# Patient Record
Sex: Female | Born: 1940 | Race: White | Hispanic: No | Smoking: Current some day smoker
Health system: Southern US, Community
[De-identification: ages and names within clinical notes are randomized; demographics above are authoritative.]

## PROBLEM LIST (undated history)

## (undated) DIAGNOSIS — E785 Hyperlipidemia, unspecified: Secondary | ICD-10-CM

## (undated) DIAGNOSIS — I10 Essential (primary) hypertension: Secondary | ICD-10-CM

## (undated) DIAGNOSIS — I4891 Unspecified atrial fibrillation: Secondary | ICD-10-CM

---
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
## 1999-10-07 ENCOUNTER — Other Ambulatory Visit: Admission: RE | Admit: 1999-10-07 | Discharge: 1999-10-07 | Payer: Self-pay | Admitting: Family Medicine

## 2000-01-13 ENCOUNTER — Ambulatory Visit (HOSPITAL_COMMUNITY): Admission: RE | Admit: 2000-01-13 | Discharge: 2000-01-13 | Payer: Self-pay | Admitting: Gastroenterology

## 2018-10-24 ENCOUNTER — Other Ambulatory Visit: Payer: Self-pay | Admitting: Family Medicine

## 2018-10-24 DIAGNOSIS — Z122 Encounter for screening for malignant neoplasm of respiratory organs: Secondary | ICD-10-CM

## 2018-11-01 ENCOUNTER — Encounter: Payer: Self-pay | Admitting: Cardiology

## 2018-11-01 DIAGNOSIS — I4891 Unspecified atrial fibrillation: Secondary | ICD-10-CM

## 2018-11-01 DIAGNOSIS — E785 Hyperlipidemia, unspecified: Secondary | ICD-10-CM

## 2018-11-01 DIAGNOSIS — E782 Mixed hyperlipidemia: Secondary | ICD-10-CM | POA: Insufficient documentation

## 2018-11-01 HISTORY — DX: Hyperlipidemia, unspecified: E78.5

## 2018-11-01 HISTORY — DX: Unspecified atrial fibrillation: I48.91

## 2018-11-02 ENCOUNTER — Ambulatory Visit: Payer: Self-pay | Admitting: Cardiology

## 2018-11-05 ENCOUNTER — Ambulatory Visit: Payer: Self-pay

## 2018-11-12 ENCOUNTER — Ambulatory Visit (INDEPENDENT_AMBULATORY_CARE_PROVIDER_SITE_OTHER): Payer: Medicare Other | Admitting: Cardiology

## 2018-11-12 ENCOUNTER — Other Ambulatory Visit: Payer: Self-pay

## 2018-11-12 ENCOUNTER — Encounter: Payer: Self-pay | Admitting: Cardiology

## 2018-11-12 VITALS — BP 157/89 | HR 83 | Temp 98.9°F | Ht 65.5 in | Wt 135.0 lb

## 2018-11-12 DIAGNOSIS — I10 Essential (primary) hypertension: Secondary | ICD-10-CM

## 2018-11-12 DIAGNOSIS — I480 Paroxysmal atrial fibrillation: Secondary | ICD-10-CM

## 2018-11-12 NOTE — Progress Notes (Signed)
Patient referred by [name], * for atrial fibrilaltion  Subjective:   Jill Jackson, female    DOB: 11/13/1940, 78 y.o.   MRN: [id number]   Chief Complaint  Patient presents with  . Atrial Fibrillation  . New Patient (Initial Visit)    HPI  78 y.o. Caucasian female with hypertension, former smoker, referred for management of atrial fibrillation.  Patient was recently undergoing preop workup before cataract surgery, when she was incidentally found to be in Afib. She was then started on eliquis by Dr. Arelia Sneddon. Cataract surgery has since been on hold. She is also supposed to undergo colonoscopy for FOBT and a dental procedure-both also on hold.   Patient lives by herself, with her grand daughter helping her with grocery shopping etc. Patient cooks her own meals, performs daily chores. She has noticed shortness of breath with minimal exertion, but denies chest pain, orthopnea, PND, leg edema, palpitations, presyncope/syncope.   Past Medical History:  Diagnosis Date  . Atrial fibrillation ([place]) 11/01/2018  . HLD (hyperlipidemia) 11/01/2018    Past Surgical History:  Procedure Laterality Date  . TUBAL LIGATION  1977    2 Social History   Socioeconomic History  . Marital status: Widowed    Spouse name: Not on file  . Number of children: 4  . Years of education: Not on file  . Highest education level: Not on file  Occupational History  . Not on file  Social Needs  . Financial resource strain: Not on file  . Food insecurity    Worry: Not on file    Inability: Not on file  . Transportation needs    Medical: Not on file    Non-medical: Not on file  Tobacco Use  . Smoking status: Former Smoker    Packs/day: 1.00    Years: 25.00    Pack years: 25.00    Types: Cigarettes    Quit date: 1999    Years since quitting: 21.6  Substance and Sexual Activity  . Alcohol use: Yes    Comment: wine once a year  . Drug use: Not Currently  . Sexual  activity: Not Currently  Lifestyle  . Physical activity    Days per week: Not on file    Minutes per session: Not on file  . Stress: Not on file  Relationships  . Social Herbalist on phone: Not on file    Gets together: Not on file    Attends religious service: Not on file    Active member of club or organization: Not on file    Attends meetings of clubs or organizations: Not on file    Relationship status: Not on file  . Intimate partner violence    Fear of current or ex partner: Not on file    Emotionally abused: Not on file    Physically abused: Not on file    Forced sexual activity: Not on file  Other Topics Concern  . Not on file  Social History Narrative  . Not on file     Family History  Problem Relation Age of Onset  . Stroke Mother   . Stroke Brother      Current Outpatient Medications on File Prior to Visit  Medication Sig Dispense Refill  . apixaban (ELIQUIS) 5 MG TABS tablet Take 5 mg by mouth 2 (two) times daily.    . diphenhydrAMINE (BENADRYL) 25 MG tablet Take 25 mg by mouth every 6 (six) hours as  needed.    . diphenhydrAMINE-APAP, sleep, (TYLENOL PM EXTRA STRENGTH PO) Take by mouth as needed.    . lovastatin (MEVACOR) 20 MG tablet Take 20 mg by mouth at bedtime.    . naproxen (NAPROSYN) 500 MG tablet Take 500 mg by mouth as needed.     No current facility-administered medications on file prior to visit.     Cardiovascular studies:  EKG 11/12/2018: Sinus rhythm 81 bpm.   Left atrial enlargement.  Nonspecific ST depression.  EKG 10/18/2018: Atrial fibrillation with RVR 122 bpm. Low voltage.   Recent labs: 10/18/2018: Glucose 80. BUN/Cr 16/0.72. eGFR 80/92. Na/K 142/4.3. Rest of the CMP normal. H/H 13/40. MCV 96. TSH 5.5 High   Review of Systems  Constitution: Negative for decreased appetite, malaise/fatigue, weight gain and weight loss.  HENT: Negative for congestion.   Eyes: Negative for visual disturbance.  Cardiovascular:  Positive for dyspnea on exertion. Negative for chest pain, leg swelling, palpitations and syncope.  Respiratory: Positive for shortness of breath. Negative for cough.   Endocrine: Negative for cold intolerance.  Hematologic/Lymphatic: Does not bruise/bleed easily.  Skin: Negative for itching and rash.  Musculoskeletal: Negative for myalgias.  Gastrointestinal: Negative for abdominal pain, nausea and vomiting.  Genitourinary: Negative for dysuria.  Neurological: Negative for dizziness and weakness.  Psychiatric/Behavioral: The patient is not nervous/anxious.   All other systems reviewed and are negative.        Vitals:   11/12/18 1337  BP: (!) 157/89  Pulse: 83  Temp: 98.9 F (37.2 C)  SpO2: 94%     Body mass index is 22.12 kg/m. Filed Weights   11/12/18 1337  Weight: 135 lb (61.2 kg)     Objective:   Physical Exam  Constitutional: She is oriented to person, place, and time. She appears well-developed and well-nourished. No distress.  HENT:  Head: Normocephalic and atraumatic.  Eyes: Pupils are equal, round, and reactive to light. Conjunctivae are normal.  Neck: No JVD present.  Cardiovascular: Normal rate and regular rhythm. Exam reveals decreased pulses.  Pulmonary/Chest: Effort normal and breath sounds normal. She has no wheezes. She has no rales.  Abdominal: Soft. Bowel sounds are normal. There is no rebound.  Musculoskeletal:        General: No edema.  Lymphadenopathy:    She has no cervical adenopathy.  Neurological: She is alert and oriented to person, place, and time. No cranial nerve deficit.  Skin: Skin is warm and dry.  Psychiatric: She has a normal mood and affect.  Nursing note and vitals reviewed.         Assessment & Recommendations:   78 y.o. Caucasian female with hypertension, former smoker, referred for management of atrial fibrillation.  Paroxysmal atrial fibrillation: Currently in sinus rhythm. CHA2DS2VAsc score 4, annual stroke risk  5%. Continue eliquis 5 mg bid. Avoid NSAIDS. Recommend echocardiogram and nuclear stress test given her exertional dyspnea-which could be angina equivalent. Low suspicion for OSA.  Hypertension: Not on any antihypertensive agents. Started lisinopril 10 mg daily. Will check BMP and lipid panel in one week. She would like to get this checked at PCP office.   Further recommendations after above testing.   Thank you for referring the patient to [place]. Please feel free to contact with any questions.  Nigel Mormon, MD [facility] [place] Pager: [phone] Office: [phone] If no answer Cell [phone]

## 2018-11-13 ENCOUNTER — Other Ambulatory Visit: Payer: Self-pay | Admitting: Cardiology

## 2018-11-13 ENCOUNTER — Telehealth: Payer: Self-pay

## 2018-11-13 DIAGNOSIS — E782 Mixed hyperlipidemia: Secondary | ICD-10-CM

## 2018-11-13 DIAGNOSIS — I10 Essential (primary) hypertension: Secondary | ICD-10-CM

## 2018-11-13 DIAGNOSIS — I480 Paroxysmal atrial fibrillation: Secondary | ICD-10-CM

## 2018-11-13 NOTE — Telephone Encounter (Signed)
LMOM with details

## 2018-11-13 NOTE — Telephone Encounter (Signed)
-----   Message from [facility], MD sent at 11/12/2018  8:56 PM EDT ----- Regarding: Naproxen Please let the patient know that I forgot to mention on 8/9. While on eliquis, she should avoid naproxen/aleve/motrin/ibuprofen as they can increase bleeding risk. Tylenol is okay.  Thanks MJP

## 2018-11-14 ENCOUNTER — Telehealth: Payer: Self-pay

## 2018-11-14 ENCOUNTER — Other Ambulatory Visit: Payer: Self-pay

## 2018-11-14 MED ORDER — LISINOPRIL 5 MG PO TABS: 5.0000 mg | ORAL_TABLET | Freq: Every day | ORAL | 3 refills | Status: DC

## 2018-11-14 NOTE — Telephone Encounter (Signed)
Hold 2 days prior to the procedure. Resume after the procedure.  Thanks MJP

## 2018-11-14 NOTE — Telephone Encounter (Signed)
Pt called in wanting confirmation on how to hold eliquis if needed prior to dental appt. She said it was discussed but she could remember your instructions.//ah

## 2018-11-15 NOTE — Telephone Encounter (Signed)
Pt aware.//ah

## 2018-11-19 ENCOUNTER — Other Ambulatory Visit: Payer: Self-pay

## 2018-11-19 ENCOUNTER — Ambulatory Visit (INDEPENDENT_AMBULATORY_CARE_PROVIDER_SITE_OTHER): Payer: Medicare Other

## 2018-11-19 DIAGNOSIS — I480 Paroxysmal atrial fibrillation: Secondary | ICD-10-CM

## 2018-11-20 NOTE — Progress Notes (Signed)
Pt aware of results 

## 2018-11-23 ENCOUNTER — Other Ambulatory Visit: Payer: Self-pay

## 2018-11-23 ENCOUNTER — Ambulatory Visit (INDEPENDENT_AMBULATORY_CARE_PROVIDER_SITE_OTHER): Payer: Medicare Other

## 2018-11-23 DIAGNOSIS — I480 Paroxysmal atrial fibrillation: Secondary | ICD-10-CM

## 2018-11-27 DIAGNOSIS — I361 Nonrheumatic tricuspid (valve) insufficiency: Secondary | ICD-10-CM | POA: Insufficient documentation

## 2018-11-27 DIAGNOSIS — I340 Nonrheumatic mitral (valve) insufficiency: Secondary | ICD-10-CM | POA: Insufficient documentation

## 2018-11-27 NOTE — Progress Notes (Signed)
  Patient referred by Elkins, Wilson Oliver, * for atrial fibrilaltion  Subjective:   Jill Jackson, female    DOB: 07/01/1940, 78 y.o.   MRN: [id number]   Chief Complaint  Patient presents with  . Atrial Fibrillation  . Results    NUC and echo  . Follow-up    HPI  78 y.o. Caucasian female with hypertension, former smoker, paroxysmal atrial fibrillation.  Workup showed EF 61%, grade 1 DD, mild myxomatous degeneration with mild to moderate mitral regurgitation, mild tricuspid regurgitation, estimated PASP 28 mmHg. Lexiscan nuclear stress test did not show ischemia/infarction.   Patient has had no new symptoms, but has stable exertional dyspnea walking uphill. She has not had any prior pulmonary evaluation.   She is tolerating anticoagulation without any bleeding issues.   Past Medical History:  Diagnosis Date  . Atrial fibrillation ([facility]) 11/01/2018  . HLD (hyperlipidemia) 11/01/2018    Past Surgical History:  Procedure Laterality Date  . TUBAL LIGATION  1977    2 Social History   Socioeconomic History  . Marital status: Widowed    Spouse name: Not on file  . Number of children: 4  . Years of education: Not on file  . Highest education level: Not on file  Occupational History  . Not on file  Social Needs  . Financial resource strain: Not on file  . Food insecurity    Worry: Not on file    Inability: Not on file  . Transportation needs    Medical: Not on file    Non-medical: Not on file  Tobacco Use  . Smoking status: Former Smoker    Packs/day: 1.00    Years: 25.00    Pack years: 25.00    Types: Cigarettes    Quit date: 1999    Years since quitting: 21.6  . Smokeless tobacco: Never Used  Substance and Sexual Activity  . Alcohol use: Yes    Comment: wine once a year  . Drug use: Not Currently  . Sexual activity: Not Currently  Lifestyle  . Physical activity    Days per week: Not on file    Minutes per session: Not on file  . Stress: Not  on file  Relationships  . Social connections    Talks on phone: Not on file    Gets together: Not on file    Attends religious service: Not on file    Active member of club or organization: Not on file    Attends meetings of clubs or organizations: Not on file    Relationship status: Not on file  . Intimate partner violence    Fear of current or ex partner: Not on file    Emotionally abused: Not on file    Physically abused: Not on file    Forced sexual activity: Not on file  Other Topics Concern  . Not on file  Social History Narrative  . Not on file     Family History  Problem Relation Age of Onset  . Stroke Mother   . Stroke Brother      Current Outpatient Medications on File Prior to Visit  Medication Sig Dispense Refill  . apixaban (ELIQUIS) 5 MG TABS tablet Take 5 mg by mouth 2 (two) times daily.    . diphenhydrAMINE (BENADRYL) 25 MG tablet Take 25 mg by mouth every 6 (six) hours as needed.    . diphenhydrAMINE-APAP, sleep, (TYLENOL PM EXTRA STRENGTH PO) Take by mouth as needed.    .     Patient referred by Elkins, Wilson Oliver, * for atrial fibrilaltion  Subjective:   Jill Jackson, female    DOB: 10/10/1940, 78 y.o.   MRN: [id number]   Chief Complaint  Patient presents with  . Atrial Fibrillation  . Results    NUC and echo  . Follow-up    HPI  78 y.o. Caucasian female with hypertension, former smoker, paroxysmal atrial fibrillation.  Workup showed EF 61%, grade 1 DD, mild myxomatous degeneration with mild to moderate mitral regurgitation, mild tricuspid regurgitation, estimated PASP 28 mmHg. Lexiscan nuclear stress test did not show ischemia/infarction.   Patient has had no new symptoms, but has stable exertional dyspnea walking uphill. She has not had any prior pulmonary evaluation.   She is tolerating anticoagulation without any bleeding issues.   Past Medical History:  Diagnosis Date  . Atrial fibrillation ([facility]) 11/01/2018  . HLD (hyperlipidemia) 11/01/2018    Past Surgical History:  Procedure Laterality Date  . TUBAL LIGATION  1977    2 Social History   Socioeconomic History  . Marital status: Widowed    Spouse name: Not on file  . Number of children: 4  . Years of education: Not on file  . Highest education level: Not on file  Occupational History  . Not on file  Social Needs  . Financial resource strain: Not on file  . Food insecurity    Worry: Not on file    Inability: Not on file  . Transportation needs    Medical: Not on file    Non-medical: Not on file  Tobacco Use  . Smoking status: Former Smoker    Packs/day: 1.00    Years: 25.00    Pack years: 25.00    Types: Cigarettes    Quit date: 1999    Years since quitting: 21.6  . Smokeless tobacco: Never Used  Substance and Sexual Activity  . Alcohol use: Yes    Comment: wine once a year  . Drug use: Not Currently  . Sexual activity: Not Currently  Lifestyle  . Physical activity    Days per week: Not on file    Minutes per session: Not on file  . Stress: Not  on file  Relationships  . Social connections    Talks on phone: Not on file    Gets together: Not on file    Attends religious service: Not on file    Active member of club or organization: Not on file    Attends meetings of clubs or organizations: Not on file    Relationship status: Not on file  . Intimate partner violence    Fear of current or ex partner: Not on file    Emotionally abused: Not on file    Physically abused: Not on file    Forced sexual activity: Not on file  Other Topics Concern  . Not on file  Social History Narrative  . Not on file     Family History  Problem Relation Age of Onset  . Stroke Mother   . Stroke Brother      Current Outpatient Medications on File Prior to Visit  Medication Sig Dispense Refill  . apixaban (ELIQUIS) 5 MG TABS tablet Take 5 mg by mouth 2 (two) times daily.    . diphenhydrAMINE (BENADRYL) 25 MG tablet Take 25 mg by mouth every 6 (six) hours as needed.    . diphenhydrAMINE-APAP, sleep, (TYLENOL PM EXTRA STRENGTH PO) Take by mouth as needed.    .     Patient referred by Elkins, Wilson Oliver, * for atrial fibrilaltion  Subjective:   Jill Jackson, female    DOB: 07/01/1940, 78 y.o.   MRN: [id number]   Chief Complaint  Patient presents with  . Atrial Fibrillation  . Results    NUC and echo  . Follow-up    HPI  78 y.o. Caucasian female with hypertension, former smoker, paroxysmal atrial fibrillation.  Workup showed EF 61%, grade 1 DD, mild myxomatous degeneration with mild to moderate mitral regurgitation, mild tricuspid regurgitation, estimated PASP 28 mmHg. Lexiscan nuclear stress test did not show ischemia/infarction.   Patient has had no new symptoms, but has stable exertional dyspnea walking uphill. She has not had any prior pulmonary evaluation.   She is tolerating anticoagulation without any bleeding issues.   Past Medical History:  Diagnosis Date  . Atrial fibrillation ([facility]) 11/01/2018  . HLD (hyperlipidemia) 11/01/2018    Past Surgical History:  Procedure Laterality Date  . TUBAL LIGATION  1977    2 Social History   Socioeconomic History  . Marital status: Widowed    Spouse name: Not on file  . Number of children: 4  . Years of education: Not on file  . Highest education level: Not on file  Occupational History  . Not on file  Social Needs  . Financial resource strain: Not on file  . Food insecurity    Worry: Not on file    Inability: Not on file  . Transportation needs    Medical: Not on file    Non-medical: Not on file  Tobacco Use  . Smoking status: Former Smoker    Packs/day: 1.00    Years: 25.00    Pack years: 25.00    Types: Cigarettes    Quit date: 1999    Years since quitting: 21.6  . Smokeless tobacco: Never Used  Substance and Sexual Activity  . Alcohol use: Yes    Comment: wine once a year  . Drug use: Not Currently  . Sexual activity: Not Currently  Lifestyle  . Physical activity    Days per week: Not on file    Minutes per session: Not on file  . Stress: Not  on file  Relationships  . Social connections    Talks on phone: Not on file    Gets together: Not on file    Attends religious service: Not on file    Active member of club or organization: Not on file    Attends meetings of clubs or organizations: Not on file    Relationship status: Not on file  . Intimate partner violence    Fear of current or ex partner: Not on file    Emotionally abused: Not on file    Physically abused: Not on file    Forced sexual activity: Not on file  Other Topics Concern  . Not on file  Social History Narrative  . Not on file     Family History  Problem Relation Age of Onset  . Stroke Mother   . Stroke Brother      Current Outpatient Medications on File Prior to Visit  Medication Sig Dispense Refill  . apixaban (ELIQUIS) 5 MG TABS tablet Take 5 mg by mouth 2 (two) times daily.    . diphenhydrAMINE (BENADRYL) 25 MG tablet Take 25 mg by mouth every 6 (six) hours as needed.    . diphenhydrAMINE-APAP, sleep, (TYLENOL PM EXTRA STRENGTH PO) Take by mouth as needed.    .

## 2018-11-30 ENCOUNTER — Ambulatory Visit (INDEPENDENT_AMBULATORY_CARE_PROVIDER_SITE_OTHER): Payer: Medicare Other | Admitting: Cardiology

## 2018-11-30 ENCOUNTER — Other Ambulatory Visit: Payer: Self-pay

## 2018-11-30 ENCOUNTER — Encounter: Payer: Self-pay | Admitting: Cardiology

## 2018-11-30 VITALS — BP 144/69 | HR 72 | Temp 99.0°F | Ht 65.0 in | Wt 135.2 lb

## 2018-11-30 DIAGNOSIS — I361 Nonrheumatic tricuspid (valve) insufficiency: Secondary | ICD-10-CM | POA: Diagnosis not present

## 2018-11-30 DIAGNOSIS — I480 Paroxysmal atrial fibrillation: Secondary | ICD-10-CM | POA: Diagnosis not present

## 2018-11-30 DIAGNOSIS — I10 Essential (primary) hypertension: Secondary | ICD-10-CM | POA: Diagnosis not present

## 2018-11-30 DIAGNOSIS — E782 Mixed hyperlipidemia: Secondary | ICD-10-CM

## 2018-12-01 ENCOUNTER — Encounter: Payer: Self-pay | Admitting: Cardiology

## 2019-01-19 ENCOUNTER — Encounter: Payer: Self-pay | Admitting: Cardiology

## 2019-03-19 ENCOUNTER — Telehealth: Payer: Self-pay

## 2019-03-19 ENCOUNTER — Other Ambulatory Visit: Payer: Self-pay

## 2019-03-19 MED ORDER — DILTIAZEM HCL ER COATED BEADS 180 MG PO CP24: 180.0000 mg | ORAL_CAPSULE | Freq: Every day | ORAL | 0 refills | Status: DC

## 2019-03-19 NOTE — Telephone Encounter (Signed)
Telephone encounter:  Reason for call: Patient calling for reflll of Diltiazem, I do not see this on her list, she said it was prescribed for her by JG .  Usual provider: MP  Last office visit: 11/30/18  Next office visit: 06/07/19   Last hospitalization: na   Current Outpatient Medications on File Prior to Visit  Medication Sig Dispense Refill  . apixaban (ELIQUIS) 5 MG TABS tablet Take 5 mg by mouth 2 (two) times daily.    . diphenhydrAMINE (BENADRYL) 25 MG tablet Take 25 mg by mouth every 6 (six) hours as needed.    . diphenhydrAMINE-APAP, sleep, (TYLENOL PM EXTRA STRENGTH PO) Take by mouth as needed.    . Latanoprostene Bunod (VYZULTA) 0.024 % SOLN Apply 1 drop to eye daily.    Marland Kitchen lisinopril (ZESTRIL) 5 MG tablet Take 1 tablet (5 mg total) by mouth daily. 90 tablet 3  . lovastatin (MEVACOR) 20 MG tablet Take 20 mg by mouth at bedtime.    . Melatonin 5 MG CHEW Chew by mouth.     No current facility-administered medications on file prior to visit.

## 2019-03-19 NOTE — Telephone Encounter (Signed)
I do not see it was prescribed by me. Can you check with her who prescribed?  Thanks MJP

## 2019-03-19 NOTE — Telephone Encounter (Signed)
Ok to refill 

## 2019-03-19 NOTE — Telephone Encounter (Signed)
She said Dr. Einar Gip

## 2019-06-07 ENCOUNTER — Ambulatory Visit: Payer: Medicare Other | Admitting: Cardiology

## 2019-06-12 ENCOUNTER — Other Ambulatory Visit: Payer: Self-pay | Admitting: Cardiology

## 2019-06-17 ENCOUNTER — Other Ambulatory Visit: Payer: Self-pay

## 2019-06-17 ENCOUNTER — Encounter: Payer: Self-pay | Admitting: Cardiology

## 2019-06-17 ENCOUNTER — Ambulatory Visit: Payer: Medicare PPO | Admitting: Cardiology

## 2019-06-17 VITALS — BP 139/64 | HR 60 | Temp 95.1°F | Ht 65.0 in | Wt 135.0 lb

## 2019-06-17 DIAGNOSIS — I480 Paroxysmal atrial fibrillation: Secondary | ICD-10-CM

## 2019-06-17 DIAGNOSIS — E782 Mixed hyperlipidemia: Secondary | ICD-10-CM

## 2019-06-17 DIAGNOSIS — I361 Nonrheumatic tricuspid (valve) insufficiency: Secondary | ICD-10-CM

## 2019-06-17 DIAGNOSIS — I10 Essential (primary) hypertension: Secondary | ICD-10-CM

## 2019-06-17 MED ORDER — APIXABAN 5 MG PO TABS: 5.0000 mg | ORAL_TABLET | Freq: Two times a day (BID) | ORAL | 3 refills | Status: DC

## 2019-06-17 MED ORDER — DILTIAZEM HCL ER COATED BEADS 180 MG PO CP24: 180.0000 mg | ORAL_CAPSULE | Freq: Every day | ORAL | 3 refills | Status: DC

## 2019-06-17 NOTE — Progress Notes (Signed)
Patient referred by [name], * for atrial fibrilaltion  Subjective:   Jill Jackson, female    DOB: 1940-09-28, 79 y.o.   MRN: [id number]   Chief Complaint  Patient presents with  . Atrial Fibrillation    HPI  79 y.o. Caucasian female with hypertension, former smoker, paroxysmal atrial fibrillation.  Workup shoed EF 61%, grade 1 DD, mild myxomatous degeneration with mild to moderate mitral regurgitation, mild tricuspid regurgitation, estimated PASP 28 mmHg. Lexiscan nuclear stress test did not show ischemia/infarction. IU suspected her exertional dyspnea was more likely due to history of smoking and possible COPD.  Patient is here for 6 month follow up. She is doing well.  She has not had any new symptoms.  She has stable mild exertional dyspnea.  Denies any chest pain.  She is tolerating anticoagulation without any bleeding issues.    Current Outpatient Medications on File Prior to Visit  Medication Sig Dispense Refill  . apixaban (ELIQUIS) 5 MG TABS tablet Take 5 mg by mouth 2 (two) times daily.    Marland Kitchen diltiazem (CARDIZEM CD) 180 MG 24 hr capsule TAKE 1 CAPSULE(180 MG) BY MOUTH DAILY 90 capsule 0  . diphenhydrAMINE (BENADRYL) 25 MG tablet Take 25 mg by mouth every 6 (six) hours as needed.    . diphenhydrAMINE-APAP, sleep, (TYLENOL PM EXTRA STRENGTH PO) Take by mouth as needed.    . Latanoprostene Bunod (VYZULTA) 0.024 % SOLN Apply 1 drop to eye daily.    Marland Kitchen lisinopril (ZESTRIL) 5 MG tablet Take 1 tablet (5 mg total) by mouth daily. 90 tablet 3  . lovastatin (MEVACOR) 20 MG tablet Take 20 mg by mouth at bedtime.    . Melatonin 5 MG CHEW Chew by mouth.     No current facility-administered medications on file prior to visit.    Cardiovascular studies:  EKG 06/17/2019: Atrial rhythm 57 bpm. Nonspecific ST abnormalities.  Echocardiogram 11/23/2018:  Left ventricle cavity is normal in size. Normal left ventricular wall thickness. Normal LV systolic  function with EF 61%. Normal global wall motion. Doppler evidence of grade I (impaired) diastolic dysfunction, normal LAP.  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 Mild tricuspid regurgitation. Estimated pulmonary artery systolic pressure is 28 mmHg. Estimated RA pressure 8 mmHg.  Lexiscan Myoview stress test 11/19/2018: Lexiscan stress test was performed. Stress EKG is non-diagnostic, as this is pharmacological stress test. Myocardial perfusion imaging is normal. LVEF 68%. Low risk study.  EKG 11/12/2018: Sinus rhythm 81 bpm.   Left atrial enlargement.  Nonspecific ST depression.  EKG 10/18/2018: Atrial fibrillation with RVR 122 bpm. Low voltage.   Recent labs: 11/23/2018: Glucose 93. BUN/Cr 15/0.78. eGFT 73. Na/K 140/4.8.  Chol 154, TG 106, HDL 70, LDL 63.  10/18/2018: Glucose 80. BUN/Cr 16/0.72. eGFR 80/92. Na/K 142/4.3. Rest of the CMP normal. H/H 13/40. MCV 96. TSH 5.5 High   Review of Systems  Cardiovascular: Positive for dyspnea on exertion. Negative for chest pain, leg swelling, palpitations and syncope.  Respiratory: Positive for shortness of breath.   Musculoskeletal: Positive for back pain.        Vitals:   06/17/19 1013  BP: 139/64  Pulse: 60  Temp: (!) 95.1 F (35.1 C)  SpO2: 97%     Body mass index is 22.47 kg/m. Filed Weights   06/17/19 1013  Weight: 135 lb (61.2 kg)     Objective:   Physical Exam  Constitutional: She appears well-developed and well-nourished.  Neck: No JVD present.  Cardiovascular: Normal rate, regular rhythm, normal heart sounds and intact distal pulses.  No murmur heard. Pulmonary/Chest: Effort normal and breath sounds normal. She has no wheezes. She has no rales.  Musculoskeletal:        General: No edema.  Nursing note and vitals reviewed.      Assessment & Recommendations:   79 y.o. Caucasian female with hypertension, former smoker, paroxysmal atrial fibrillation, mild to  moderate MR, mild TR.  Paroxysmal atrial fibrillation: Currently in stable ectopic atrial rhythm. No indication for antiarrhythmic therapy. CHA2DS2VAsc score 4, annual stroke risk 5%. Continue eliquis 5 mg bid. Avoid NSAIDS. Low suspicion for OSA.  Hypertension: Continue lisinopril 10 mg daily.  Mild to moderate MR, mild TR: Recommend repeat echocardiogram in 04/2020.  Exertional dyspnea: Cardiac workup is under whelming for her symptoms. Given her h/o smoking, consider pulmonary function testing.   Follow-up in 1 year.   Nigel Mormon, MD [facility] [place] Pager: [phone] Office: [phone] If no answer Cell [phone]

## 2019-07-26 ENCOUNTER — Observation Stay (HOSPITAL_COMMUNITY)
Admission: EM | Admit: 2019-07-26 | Discharge: 2019-07-27 | Disposition: A | Discharge status: Home / Self Care | Payer: Medicare PPO | Attending: Family Medicine | Admitting: Family Medicine

## 2019-07-26 ENCOUNTER — Encounter (HOSPITAL_COMMUNITY): Payer: Self-pay | Admitting: Emergency Medicine

## 2019-07-26 ENCOUNTER — Other Ambulatory Visit: Payer: Self-pay

## 2019-07-26 ENCOUNTER — Emergency Department (HOSPITAL_COMMUNITY): Payer: Medicare PPO

## 2019-07-26 DIAGNOSIS — Z79899 Other long term (current) drug therapy: Secondary | ICD-10-CM | POA: Diagnosis not present

## 2019-07-26 DIAGNOSIS — Z20822 Contact with and (suspected) exposure to covid-19: Secondary | ICD-10-CM | POA: Diagnosis not present

## 2019-07-26 DIAGNOSIS — I2510 Atherosclerotic heart disease of native coronary artery without angina pectoris: Secondary | ICD-10-CM | POA: Insufficient documentation

## 2019-07-26 DIAGNOSIS — Z7901 Long term (current) use of anticoagulants: Secondary | ICD-10-CM | POA: Diagnosis not present

## 2019-07-26 DIAGNOSIS — R0602 Shortness of breath: Secondary | ICD-10-CM

## 2019-07-26 DIAGNOSIS — Z881 Allergy status to other antibiotic agents status: Secondary | ICD-10-CM | POA: Insufficient documentation

## 2019-07-26 DIAGNOSIS — J439 Emphysema, unspecified: Secondary | ICD-10-CM | POA: Insufficient documentation

## 2019-07-26 DIAGNOSIS — E782 Mixed hyperlipidemia: Secondary | ICD-10-CM | POA: Diagnosis present

## 2019-07-26 DIAGNOSIS — I700 Atherosclerosis of aorta: Secondary | ICD-10-CM | POA: Insufficient documentation

## 2019-07-26 DIAGNOSIS — R079 Chest pain, unspecified: Principal | ICD-10-CM | POA: Insufficient documentation

## 2019-07-26 DIAGNOSIS — I10 Essential (primary) hypertension: Secondary | ICD-10-CM | POA: Insufficient documentation

## 2019-07-26 DIAGNOSIS — I4891 Unspecified atrial fibrillation: Secondary | ICD-10-CM | POA: Diagnosis present

## 2019-07-26 DIAGNOSIS — I480 Paroxysmal atrial fibrillation: Secondary | ICD-10-CM | POA: Diagnosis not present

## 2019-07-26 DIAGNOSIS — Z87891 Personal history of nicotine dependence: Secondary | ICD-10-CM | POA: Diagnosis not present

## 2019-07-26 DIAGNOSIS — Z823 Family history of stroke: Secondary | ICD-10-CM | POA: Diagnosis not present

## 2019-07-26 DIAGNOSIS — I209 Angina pectoris, unspecified: Secondary | ICD-10-CM | POA: Diagnosis present

## 2019-07-26 DIAGNOSIS — Z720 Tobacco use: Secondary | ICD-10-CM

## 2019-07-26 LAB — CBC
HCT: 39.4 % (ref 36.0–46.0)
Hemoglobin: 12.8 g/dL (ref 12.0–15.0)
MCH: 31.4 pg (ref 26.0–34.0)
MCHC: 32.5 g/dL (ref 30.0–36.0)
MCV: 96.6 fL (ref 80.0–100.0)
Platelets: 329 10*3/uL (ref 150–400)
RBC: 4.08 MIL/uL (ref 3.87–5.11)
RDW: 13.2 % (ref 11.5–15.5)
WBC: 9.3 10*3/uL (ref 4.0–10.5)
nRBC: 0 % (ref 0.0–0.2)

## 2019-07-26 LAB — BASIC METABOLIC PANEL
Anion gap: 9 (ref 5–15)
BUN: 20 mg/dL (ref 8–23)
CO2: 25 mmol/L (ref 22–32)
Calcium: 9.2 mg/dL (ref 8.9–10.3)
Chloride: 104 mmol/L (ref 98–111)
Creatinine, Ser: 0.81 mg/dL (ref 0.44–1.00)
GFR calc Af Amer: >60 mL/min (ref >60)
GFR calc non Af Amer: >60 mL/min (ref >60)
Glucose, Bld: 103 mg/dL — ABNORMAL HIGH (ref 70–99)
Potassium: 4.3 mmol/L (ref 3.5–5.1)
Sodium: 138 mmol/L (ref 135–145)

## 2019-07-26 LAB — TROPONIN I (HIGH SENSITIVITY)
Troponin I (High Sensitivity): 6 ng/L (ref <18)
Troponin I (High Sensitivity): 7 ng/L (ref <18)

## 2019-07-26 MED ORDER — SODIUM CHLORIDE 0.9% FLUSH
3.0000 mL | Freq: Once | INTRAVENOUS | Status: AC
  Administered 2019-07-26: 3 mL via INTRAVENOUS

## 2019-07-26 NOTE — ED Provider Notes (Signed)
[facility] EMERGENCY DEPARTMENT Provider Note   CSN: [id number] Arrival date & time: 07/26/19  1606     History Chief Complaint  Patient presents with  . Shortness of Breath    Jill Jackson is a 79 y.o. female with a hx of paroxsymal a-fib (on Eliquis) presents to the Emergency Department complaining of gradual, persistent, progressively worsening "feeling unwell" onset 3 days ago.  Pt reports some intermittent left sided chest pain and SOB which became constant this morning.  Pt reports peripheral edema, generalized weakness, lightheadedness, nausea and intermittent headaches as well. She denies known sick contacts.  Pt reports she presented to her PCP and was told that she was in a-fib in the office, however ECG taken at the office does not show a-fib.  Pt reports her shortness of breath worsens significantly with exertion which is abnormal.  Pt is a current smoker.  Nothing seems to make the symptoms better.  No treatments PTA.     The history is provided by the patient and medical records. No language interpreter was used.    HPI: A 79 year old patient with a history of hypertension and hypercholesterolemia presents for evaluation of chest pain. Initial onset of pain was more than 6 hours ago. The patient's chest pain is sharp and is worse with exertion. The patient complains of nausea. The patient's chest pain is middle- or left-sided, is not well-localized, is not described as heaviness/pressure/tightness and does not radiate to the arms/jaw/neck. The patient denies diaphoresis. The patient has smoked in the past 90 days. The patient has no history of stroke, has no history of peripheral artery disease, denies any history of treated diabetes, has no relevant family history of coronary artery disease (first degree relative at less than age 36) and does not have an elevated BMI (>=30).   Past Medical History:  Diagnosis Date  . Atrial fibrillation (HCC)  11/01/2018  . HLD (hyperlipidemia) 11/01/2018    Patient Active Problem List   Diagnosis Date Noted  . 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 Nonrheumatic mitral valve regurgitation 11/27/2018  . Essential hypertension 11/12/2018  . Mixed hyperlipidemia 11/01/2018  . Atrial fibrillation (HCC) 11/01/2018    Past Surgical History:  Procedure Laterality Date  . TUBAL LIGATION  1977     OB History   No obstetric history on file.     Family History  Problem Relation Age of Onset  . Stroke Mother   . Stroke Brother     Social History   Tobacco Use  . Smoking status: Former Smoker    Packs/day: 1.00    Years: 25.00    Pack years: 25.00    Types: Cigarettes    Quit date: 1999    Years since quitting: 22.3  . Smokeless tobacco: Never Used  Substance Use Topics  . Alcohol use: Yes    Comment: wine once a year  . Drug use: Not Currently    Home Medications Prior to Admission medications   Medication Sig Start Date End Date Taking? Authorizing Provider  acetaminophen (TYLENOL) 500 MG tablet Take 500 mg by mouth 2 (two) times daily as needed for mild pain or headache.   Yes [provider]  alendronate (FOSAMAX) 70 MG tablet Take 1 tablet by mouth every Wednesday. Drink a glass of water 30 minutes before medication and sit upright for 30 minutes. 07/09/19  Yes [provider]  apixaban (ELIQUIS) 5 MG TABS tablet Take 1 tablet (5 mg total) by  mouth 2 (two) times daily. 06/17/19  Yes Patwardhan, Manish J, MD  bismuth subsalicylate (PEPTO BISMOL) 262 MG/15ML suspension Take 15 mLs by mouth every 6 (six) hours as needed for indigestion.   Yes [provider]  diltiazem (CARDIZEM CD) 180 MG 24 hr capsule Take 1 capsule (180 mg total) by mouth daily. 06/17/19  Yes Patwardhan, Manish J, MD  diphenhydrAMINE (BENADRYL) 25 MG tablet Take 25 mg by mouth every 6 (six) hours as needed for itching or allergies.    Yes [provider]    diphenhydrAMINE-APAP, sleep, (TYLENOL PM EXTRA STRENGTH PO) Take 0.5 tablets by mouth at bedtime as needed (Sleep).    Yes [provider]  latanoprost (XALATAN) 0.005 % ophthalmic solution Place 1 drop into both eyes at bedtime.   Yes [provider]  lovastatin (MEVACOR) 20 MG tablet Take 20 mg by mouth at bedtime.   Yes [provider]  Melatonin 5 MG CHEW Chew 1 tablet by mouth at bedtime as needed (Sleep).    Yes [provider]  omeprazole (PRILOSEC) 40 MG capsule Take 40 mg by mouth daily as needed (Acid reflux).    Yes [provider]  psyllium (METAMUCIL) 58.6 % powder Take 1 packet by mouth daily as needed (GI issues).    Yes [provider]    Allergies    Tetracyclines & related  Review of Systems   Review of Systems  Constitutional: Positive for fatigue. Negative for appetite change, diaphoresis, fever and unexpected weight change.  HENT: Negative for mouth sores.   Eyes: Negative for visual disturbance.  Respiratory: Positive for shortness of breath. Negative for cough, chest tightness and wheezing.   Cardiovascular: Positive for chest pain and leg swelling.  Gastrointestinal: Positive for nausea. Negative for abdominal pain, constipation, diarrhea and vomiting.  Endocrine: Negative for polydipsia, polyphagia and polyuria.  Genitourinary: Negative for dysuria, frequency, hematuria and urgency.  Musculoskeletal: Negative for back pain and neck stiffness.  Skin: Negative for rash.  Allergic/Immunologic: Negative for immunocompromised state.  Neurological: Positive for light-headedness and headaches. Negative for syncope.  Hematological: Does not bruise/bleed easily.  Psychiatric/Behavioral: Negative for sleep disturbance. The patient is not nervous/anxious.     Physical Exam Updated Vital Signs BP 131/67 (BP Location: Left Arm)   Pulse 63   Temp 98.4 F (36.9 C) (Oral)   Resp 16   Ht 5\' 3"  (1.6 m)   Wt 61.7 kg    SpO2 96%   BMI 24.09 kg/m   Physical Exam Vitals and nursing note reviewed.  Constitutional:      General: She is not in acute distress.    Appearance: She is not diaphoretic.  HENT:     Head: Normocephalic.  Eyes:     General: No scleral icterus.    Conjunctiva/sclera: Conjunctivae normal.  Cardiovascular:     Rate and Rhythm: Normal rate and regular rhythm.     Pulses: Normal pulses.          Radial pulses are 2+ on the right side and 2+ on the left side.     Heart sounds: Murmur present.  Pulmonary:     Effort: Pulmonary effort is normal. No tachypnea, accessory muscle usage, prolonged expiration, respiratory distress or retractions.     Breath sounds: Normal breath sounds. No stridor.     Comments: Equal chest rise. No increased work of breathing. Abdominal:     General: There is no distension.     Palpations: Abdomen is soft.  Tenderness: There is no abdominal tenderness. There is no guarding or rebound.  Musculoskeletal:     Cervical back: Normal range of motion.     Comments: Moves all extremities equally and without difficulty.  Skin:    General: Skin is warm and dry.     Capillary Refill: Capillary refill takes less than 2 seconds.  Neurological:     Mental Status: She is alert.     GCS: GCS eye subscore is 4. GCS verbal subscore is 5. GCS motor subscore is 6.     Comments: Speech is clear and goal oriented.  Psychiatric:        Mood and Affect: Mood normal.     ED Results / Procedures / Treatments   Labs (all labs ordered are listed, but only abnormal results are displayed) Labs Reviewed  BASIC METABOLIC PANEL - Abnormal; Notable for the following components:      Result Value   Glucose, Bld 103 (*)    All other components within normal limits  D-DIMER, QUANTITATIVE (NOT AT [facility]) - Abnormal; Notable for the following components:   D-Dimer, Quant 0.66 (*)    All other components within normal limits  RESPIRATORY PANEL BY RT PCR (FLU A&B, COVID)  CBC    BRAIN NATRIURETIC PEPTIDE  POC SARS CORONAVIRUS 2 AG -  ED  TROPONIN I (HIGH SENSITIVITY)  TROPONIN I (HIGH SENSITIVITY)    EKG EKG Interpretation  Date/Time:  Friday July 26 2019 16:09:53 EDT Ventricular Rate:  71 PR Interval:  160 QRS Duration: 70 QT Interval:  376 QTC Calculation: 408 R Axis:   -11 Text Interpretation: Normal sinus rhythm with sinus arrhythmia Possible Left atrial enlargement Confirmed by Nicanor Alcon, April ([id number]) on 07/26/2019 11:18:56 PM   Radiology DG Chest 2 View  Result Date: 07/26/2019 CLINICAL DATA:  Shortness of breath EXAM: CHEST - 2 VIEW COMPARISON:  None. FINDINGS: Frontal and lateral views of the chest demonstrate an unremarkable cardiac silhouette. No airspace disease, effusion, or pneumothorax. Background interstitial prominence consistent with history of tobacco abuse. IMPRESSION: 1. No acute intrathoracic process. Electronically Signed   By: Sharlet Salina M.D.   On: 07/26/2019 16:58    Procedures Procedures (including critical care time)  Medications Ordered in ED Medications  sodium chloride flush (NS) 0.9 % injection 3 mL (3 mLs Intravenous Given 07/26/19 2358)    ED Course  I have reviewed the triage vital signs and the nursing notes.  Pertinent labs & imaging results that were available during my care of the patient were reviewed by me and considered in my medical decision making (see chart for details).  Clinical Course as of Jul 27 347  Sat Jul 27, 2019  0104 Elevated, but age adjusted cutoff is 0.79.  D-Dimer, Quant(!): 0.66 [HM]  0139 Lexiscan Myoview stress test 11/19/2018: -Lexiscan stress test was performed. Stress EKG is non-diagnostic, as this is pharmacological stress test. -Myocardial perfusion imaging is normal. LVEF 68%. -Low risk study.   [HM]    Clinical Course User Index [HM] Miel Wisener, Boyd Kerbs   MDM Rules/Calculators/A&P HEAR Score: 6                    Patient presents with chest pain shortness of  breath intermittent for several days becoming constant for the last several hours.  Symptoms are worse with exertion.  Elevated dimer however normal with age adjustment.  Initial and repeat troponin within normal limits here in the emergency department however she continues to have  exertional symptoms while here.  Heart score 6.  Last provocative testing in August 2020 which appeared to be low risk on myocardial perfusion with Lexiscan.  She has never had a heart cath.  High risk chest pain with exertion.  Will admit for rule out.  The patient was discussed with and seen by Dr. Blinda Leatherwood who agrees with the treatment plan.  3:59 AM Discussed patient's case with hospitalist, Dr. Morene Antu.  I have recommended admission and patient (and family if present) agree with this plan. Admitting physician will place admission orders.    Final Clinical Impression(s) / ED Diagnoses Final diagnoses:  Shortness of breath on exertion  Chest pain, unspecified type    Rx / DC Orders ED Discharge Orders    None       Devone Tousley, Boyd Kerbs 07/27/19 0400    Palumbo, April, MD 07/27/19 0403

## 2019-07-26 NOTE — ED Triage Notes (Signed)
Pt BIB [facility] from her dr.'s office. C/o shortness of breath with exertion and tightness in her chest on occasion. States the chest tightness normally comes and goes quickly, but today tightness lasted longer than usual.

## 2019-07-27 ENCOUNTER — Encounter (HOSPITAL_COMMUNITY): Payer: Self-pay | Admitting: Family Medicine

## 2019-07-27 ENCOUNTER — Observation Stay (HOSPITAL_COMMUNITY): Payer: Medicare PPO

## 2019-07-27 DIAGNOSIS — Z720 Tobacco use: Secondary | ICD-10-CM

## 2019-07-27 DIAGNOSIS — I209 Angina pectoris, unspecified: Secondary | ICD-10-CM | POA: Diagnosis present

## 2019-07-27 DIAGNOSIS — R079 Chest pain, unspecified: Secondary | ICD-10-CM | POA: Diagnosis not present

## 2019-07-27 LAB — D-DIMER, QUANTITATIVE: D-Dimer, Quant: 0.66 ug/mL-FEU — ABNORMAL HIGH (ref 0.00–0.50)

## 2019-07-27 LAB — BRAIN NATRIURETIC PEPTIDE: B Natriuretic Peptide: 80.3 pg/mL (ref 0.0–100.0)

## 2019-07-27 LAB — RESPIRATORY PANEL BY RT PCR (FLU A&B, COVID)
Influenza A by PCR: NEGATIVE
Influenza B by PCR: NEGATIVE
SARS Coronavirus 2 by RT PCR: NEGATIVE

## 2019-07-27 LAB — TROPONIN I (HIGH SENSITIVITY): Troponin I (High Sensitivity): 7 ng/L (ref <18)

## 2019-07-27 LAB — MAGNESIUM: Magnesium: 2.3 mg/dL (ref 1.7–2.4)

## 2019-07-27 LAB — POC SARS CORONAVIRUS 2 AG -  ED: SARS Coronavirus 2 Ag: NEGATIVE

## 2019-07-27 MED ORDER — ASPIRIN 81 MG PO CHEW
324.0000 mg | CHEWABLE_TABLET | Freq: Once | ORAL | Status: AC
  Administered 2019-07-27: 07:00:00 324 mg via ORAL
  Filled 2019-07-27: qty 4

## 2019-07-27 MED ORDER — ACETAMINOPHEN 500 MG PO TABS: 500.0000 mg | ORAL_TABLET | Freq: Two times a day (BID) | ORAL | Status: DC | PRN

## 2019-07-27 MED ORDER — PRAVASTATIN SODIUM 10 MG PO TABS: 20.0000 mg | ORAL_TABLET | Freq: Every day | ORAL | Status: DC

## 2019-07-27 MED ORDER — MELATONIN 3 MG PO TABS: 6.0000 mg | ORAL_TABLET | Freq: Every evening | ORAL | Status: DC | PRN

## 2019-07-27 MED ORDER — ONDANSETRON HCL 4 MG/2ML IJ SOLN: 4.0000 mg | Freq: Four times a day (QID) | INTRAMUSCULAR | Status: DC | PRN

## 2019-07-27 MED ORDER — DILTIAZEM HCL ER COATED BEADS 180 MG PO CP24
180.0000 mg | ORAL_CAPSULE | Freq: Every day | ORAL | Status: DC
  Administered 2019-07-27: 14:00:00 180 mg via ORAL
  Filled 2019-07-27: qty 1

## 2019-07-27 MED ORDER — IOHEXOL 350 MG/ML SOLN
50.0000 mL | Freq: Once | INTRAVENOUS | Status: AC | PRN
  Administered 2019-07-27: 50 mL via INTRAVENOUS

## 2019-07-27 MED ORDER — APIXABAN 5 MG PO TABS
5.0000 mg | ORAL_TABLET | Freq: Two times a day (BID) | ORAL | Status: DC
  Administered 2019-07-27: 14:00:00 5 mg via ORAL
  Filled 2019-07-27 (×2): qty 1

## 2019-07-27 NOTE — ED Notes (Signed)
RN Alvino Chapel informed pt POC covid test neg

## 2019-07-27 NOTE — ED Notes (Signed)
Cordelia Pen fields daugher [phone] looking for an update on pt

## 2019-07-27 NOTE — Discharge Instructions (Signed)
Per Dr. Mahala Menghini, these are your discharge instructions.   Follow-up with Dr. Rosemary Holms on Tuesday  Someone will call you next week to schedule pulmonary function testing as outpatient.   Please follow-up with your cardiologist in the outpatient setting.  Please ensure that you get lab work done in 1 week and that you quit smoking completely If you have severe shortness of breath chest pain or any other issue please return to the emergency room and/or call 911.

## 2019-07-27 NOTE — Progress Notes (Signed)
Agree with plan of care as per my partner Dr. Morene Antu who evaluated this patient this morning  79 year old female PAF Eliquis/HTN/smoker admit with CP = several days nonradiating nonpleuritic pain LL anterior chest 5-6 times/D Seen by [facility] cardiology 06/17/2019 Lexiscan stress test did not show ischemia felt to be possibly exertional dyspnea secondary to smoking COPD Heart score 5 BNP 80 Mag 2.3 Troponin 7 cxr neg dimer 0.66    On exam awake coherent pleasant cachectic By temporalis wasting Arcus senilis No icterus no pallor Throat soft supple no thyromegaly no submandibular lymphadenopathy Chest clear no rales no rhonchi no wheeze Point tenderness over left 6th/7th rib underneath breast not worsened per patient by laying or sitting but worsened by ambulation  PlanP given D-dimer is elevated I believe it is imperative to rule out PE despite the fact that she is on Eliquis which is unusual Will also obtain spirometry and monitor her on telemetry If CT scan is positive-she will need dose-based Lovenox given she is hemodynamically stable at this time I expect she can discharge in the next 24 to 48 hours with discussion with her cardiologist as an outpatient regarding findings of her spirometry   Pleas Koch, MD [facility] 8:52 AM

## 2019-07-27 NOTE — ED Notes (Signed)
Paged J Samtani for diet orders.  Pt is using incentive spirometer.

## 2019-07-27 NOTE — Discharge Summary (Signed)
Physician Discharge Summary  Jill Jackson [id number] DOB: 1940-07-30 DOA: 07/26/2019  PCP: Kaleen Mask, MD  Admit date: 07/26/2019 Discharge date: 07/27/2019  Time spent:  Recommendations for Outpatient Follow-up:  1. Need sOP follow up Dr. Rosemary Holms 2. Needs PFT as OP  Discharge Diagnoses:  Principal Problem:   Chest pain Active Problems:   Mixed hyperlipidemia   Atrial fibrillation (HCC)   Essential hypertension   Tobacco use   Discharge Condition: good  Diet recommendation: reg  Filed Weights   07/26/19 1607 07/27/19 1340  Weight: 61.7 kg 61.2 kg    History of present illness:  79 year old female PAF Eliquis/HTN/smoker admit with CP = several days nonradiating nonpleuritic pain LL anterior chest 5-6 times/D Seen by [facility] cardiology 06/17/2019 Lexiscan stress test did not show ischemia felt to be possibly exertional dyspnea secondary to smoking COPD Heart score 5 BNP 80 Mag 2.3 Troponin 7 cxr neg dimer 0.66 CTA of chest was neg Given 3 neg troponins and very low  Pre-test prob, this isnt Cardiogenic CP We have a safe plan for d/c She should follow up with PCP and Dr. Rosemary Holms OP setting for further care coordination--will cc her cardiologist  And PCP to coordinate PFT's  Discharge Exam: Vitals:   07/27/19 1327 07/27/19 1328  BP: (!) 142/64   Pulse:  63  Resp:  12  Temp:    SpO2:  97%    As per my prior exam this am   Discharge Instructions    Allergies as of 07/27/2019      Reactions   Tetracyclines & Related Nausea And Vomiting      Medication List    STOP taking these medications   lovastatin 20 MG tablet Commonly known as: MEVACOR   Melatonin 5 MG Chew     TAKE these medications   acetaminophen 500 MG tablet Commonly known as: TYLENOL Take 500 mg by mouth 2 (two) times daily as needed for mild pain or headache.   alendronate 70 MG tablet Commonly known as: FOSAMAX Take 1 tablet by mouth every  Wednesday. Drink a glass of water 30 minutes before medication and sit upright for 30 minutes.   apixaban 5 MG Tabs tablet Commonly known as: ELIQUIS Take 1 tablet (5 mg total) by mouth 2 (two) times daily.   bismuth subsalicylate 262 MG/15ML suspension Commonly known as: PEPTO BISMOL Take 15 mLs by mouth every 6 (six) hours as needed for indigestion.   diltiazem 180 MG 24 hr capsule Commonly known as: CARDIZEM CD Take 1 capsule (180 mg total) by mouth daily.   diphenhydrAMINE 25 MG tablet Commonly known as: BENADRYL Take 25 mg by mouth every 6 (six) hours as needed for itching or allergies.   latanoprost 0.005 % ophthalmic solution Commonly known as: XALATAN Place 1 drop into both eyes at bedtime.   omeprazole 40 MG capsule Commonly known as: PRILOSEC Take 40 mg by mouth daily as needed (Acid reflux).   psyllium 58.6 % powder Commonly known as: METAMUCIL Take 1 packet by mouth daily as needed (GI issues).   TYLENOL PM EXTRA STRENGTH PO Take 0.5 tablets by mouth at bedtime as needed (Sleep).      Allergies  Allergen Reactions  . Tetracyclines & Related Nausea And Vomiting      The results of significant diagnostics from this hospitalization (including imaging, microbiology, ancillary and laboratory) are listed below for reference.    Significant Diagnostic Studies: DG Chest 2 View  Result Date: 07/26/2019 CLINICAL  DATA:  Shortness of breath EXAM: CHEST - 2 VIEW COMPARISON:  None. FINDINGS: Frontal and lateral views of the chest demonstrate an unremarkable cardiac silhouette. No airspace disease, effusion, or pneumothorax. Background interstitial prominence consistent with history of tobacco abuse. IMPRESSION: 1. No acute intrathoracic process. Electronically Signed   By: Sharlet Salina M.D.   On: 07/26/2019 16:58   CT ANGIO CHEST PE W OR WO CONTRAST  Result Date: 07/27/2019 CLINICAL DATA:  79 year old female with acute chest pain and shortness of breath for 1 day.  EXAM: CT ANGIOGRAPHY CHEST WITH CONTRAST TECHNIQUE: Multidetector CT imaging of the chest was performed using the standard protocol during bolus administration of intravenous contrast. Multiplanar CT image reconstructions and MIPs were obtained to evaluate the vascular anatomy. CONTRAST:  80mL OMNIPAQUE IOHEXOL 350 MG/ML SOLN COMPARISON:  07/26/2019 chest radiograph FINDINGS: Cardiovascular: Satisfactory opacification of the pulmonary arteries to the segmental level. No evidence of pulmonary embolism. Normal heart size. No pericardial effusion. Thoracic aortic atherosclerotic calcifications noted without aneurysm. LAD, circumflex and RIGHT coronary atherosclerotic calcifications noted. Mediastinum/Nodes: No enlarged mediastinal, hilar, or axillary lymph nodes. Thyroid gland, trachea, and esophagus demonstrate no significant findings. Lungs/Pleura: Centrilobular emphysema is noted. No airspace disease, consolidation, nodule, mass, pleural effusion or pneumothorax identified. Upper Abdomen: No acute abnormality. Musculoskeletal: No acute or suspicious bony abnormalities. Review of the MIP images confirms the above findings. IMPRESSION: 1. No evidence of acute abnormality. No evidence of pulmonary emboli. 2. Coronary artery disease. 3. Aortic Atherosclerosis (ICD10-I70.0) and Emphysema (ICD10-J43.9). Electronically Signed   By: Harmon Pier M.D.   On: 07/27/2019 11:11    Microbiology: Recent Results (from the past 240 hour(s))  Respiratory Panel by RT PCR (Flu A&B, Covid) - Nasopharyngeal Swab     Status: None   Collection Time: 07/27/19  5:15 AM   Specimen: Nasopharyngeal Swab  Result Value Ref Range Status   SARS Coronavirus 2 by RT PCR NEGATIVE NEGATIVE Final    Comment: (NOTE) SARS-CoV-2 target nucleic acids are NOT DETECTED. The SARS-CoV-2 RNA is generally detectable in upper respiratoy specimens during the acute phase of infection. The lowest concentration of SARS-CoV-2 viral copies this assay can  detect is 131 copies/mL. A negative result does not preclude SARS-Cov-2 infection and should not be used as the sole basis for treatment or other patient management decisions. A negative result may occur with  improper specimen collection/handling, submission of specimen other than nasopharyngeal swab, presence of viral mutation(s) within the areas targeted by this assay, and inadequate number of viral copies (<131 copies/mL). A negative result must be combined with clinical observations, patient history, and epidemiological information. The expected result is Negative. Fact Sheet for Patients:  https://www.moore.com/ Fact Sheet for Healthcare Providers:  https://www.young.biz/ This test is not yet ap proved or cleared by the [place] FDA and  has been authorized for detection and/or diagnosis of SARS-CoV-2 by FDA under an Emergency Use Authorization (EUA). This EUA will remain  in effect (meaning this test can be used) for the duration of the COVID-19 declaration under Section 564(b)(1) of the Act, 21 U.S.C. section 360bbb-3(b)(1), unless the authorization is terminated or revoked sooner.    Influenza A by PCR NEGATIVE NEGATIVE Final   Influenza B by PCR NEGATIVE NEGATIVE Final    Comment: (NOTE) The Xpert Xpress SARS-CoV-2/FLU/RSV assay is intended as an aid in  the diagnosis of influenza from Nasopharyngeal swab specimens and  should not be used as a sole basis for treatment. Nasal washings and  aspirates are  unacceptable for Xpert Xpress SARS-CoV-2/FLU/RSV  testing. Fact Sheet for Patients: PinkCheek.be Fact Sheet for Healthcare Providers: GravelBags.it This test is not yet approved or cleared by the [place] FDA and  has been authorized for detection and/or diagnosis of SARS-CoV-2 by  FDA under an Emergency Use Authorization (EUA). This EUA will remain  in effect (meaning  this test can be used) for the duration of the  Covid-19 declaration under Section 564(b)(1) of the Act, 21  U.S.C. section 360bbb-3(b)(1), unless the authorization is  terminated or revoked. Performed at [facility], [place] [address], [place], [place] [address]      Labs: Basic Metabolic Panel: Recent Labs  Lab 07/26/19 1610 07/26/19 2044  NA 138  --   K 4.3  --   CL 104  --   CO2 25  --   GLUCOSE 103*  --   BUN 20  --   CREATININE 0.81  --   CALCIUM 9.2  --   MG  --  2.3   Liver Function Tests: No results for input(s): AST, ALT, ALKPHOS, BILITOT, PROT, ALBUMIN in the last 168 hours. No results for input(s): LIPASE, AMYLASE in the last 168 hours. No results for input(s): AMMONIA in the last 168 hours. CBC: Recent Labs  Lab 07/26/19 1610  WBC 9.3  HGB 12.8  HCT 39.4  MCV 96.6  PLT 329   Cardiac Enzymes: No results for input(s): CKTOTAL, CKMB, CKMBINDEX, TROPONINI in the last 168 hours. BNP: BNP (last 3 results) Recent Labs    07/26/19 2348  BNP 80.3    ProBNP (last 3 results) No results for input(s): PROBNP in the last 8760 hours.  CBG: No results for input(s): GLUCAP in the last 168 hours.     Signed:  Nita Sells MD   [facility] 07/27/2019, 3:14 PM

## 2019-07-27 NOTE — H&P (Signed)
[facility] History and Physical  Jill Jackson WER:[id number] DOB: [birth year] DOA: 07/26/2019  Referring EDP: [place]augh PCP: Kaleen Mask, MD   Chief Complaint: Chest Pain   HPI: Jill Jackson is a 79 y.o. female with PMH of PAF on Eliquis, HTN and former smoker presents to ED with chest pain and admitted for ACS rule-out.  Patient reports that 2-3 days ago she started feeling somewhat fatigued and overall "unwell" without being able to describe further or localize pain or problem. Over the last few days she has had non-radiating, non-pleuritic intermittent chest pain over the lower left anterior chest that lasts for a few seconds and then goes away. This will happen maybe 5-6 times daily. She does not note anything that makes it better or worse. Denies pain like this previously. Reports some pain in her left leg since last night that feels like she pulled a muscle. She has SOB at baseline but it might be slightly worse now. She smokes very infrequently now but she used to smoke heavily. She was diagnosed with a fib last summer. Denies headache, dizziness, fever, chills, cough, abdominal pain, nausea, vomiting, diarrhea, constipation, dysuria, hematuria, hematochezia, melena, difficulty moving arms/legs, speech difficulty, trouble eating, confusion or any other complaints.  In the ED: Vitals stable. Labs remarkable for: BMP, CBC and trop x2 WNL. D-dimer normal for age. CXR non-acute. EDP requested admission for further observation due to risk factors; ACS rule-out.  Review of Systems:  All other systems negative unless noted above in HPI.   Past Medical History:  Diagnosis Date  . Atrial fibrillation (HCC) 11/01/2018  . HLD (hyperlipidemia) 11/01/2018   Past Surgical History:  Procedure Laterality Date  . TUBAL LIGATION  1977   Social History:  reports that she quit smoking about 22 years ago. Her smoking use included cigarettes. She has a 25.00  pack-year smoking history. She has never used smokeless tobacco. She reports current alcohol use. She reports previous drug use.  Allergies  Allergen Reactions  . Tetracyclines & Related Nausea And Vomiting    Family History  Problem Relation Age of Onset  . Stroke Mother   . Stroke Brother      Prior to Admission medications   Medication Sig Start Date End Date Taking? Authorizing Provider  acetaminophen (TYLENOL) 500 MG tablet Take 500 mg by mouth 2 (two) times daily as needed for mild pain or headache.   Yes [provider]  alendronate (FOSAMAX) 70 MG tablet Take 1 tablet by mouth every Wednesday. Drink a glass of water 30 minutes before medication and sit upright for 30 minutes. 07/09/19  Yes [provider]  apixaban (ELIQUIS) 5 MG TABS tablet Take 1 tablet (5 mg total) by mouth 2 (two) times daily. 06/17/19  Yes Patwardhan, Manish J, MD  bismuth subsalicylate (PEPTO BISMOL) 262 MG/15ML suspension Take 15 mLs by mouth every 6 (six) hours as needed for indigestion.   Yes [provider]  diltiazem (CARDIZEM CD) 180 MG 24 hr capsule Take 1 capsule (180 mg total) by mouth daily. 06/17/19  Yes Patwardhan, Manish J, MD  diphenhydrAMINE (BENADRYL) 25 MG tablet Take 25 mg by mouth every 6 (six) hours as needed for itching or allergies.    Yes [provider]  diphenhydrAMINE-APAP, sleep, (TYLENOL PM EXTRA STRENGTH PO) Take 0.5 tablets by mouth at bedtime as needed (Sleep).    Yes [provider]  latanoprost (XALATAN) 0.005 % ophthalmic solution Place 1 drop into both eyes at bedtime.  Yes [provider]  lovastatin (MEVACOR) 20 MG tablet Take 20 mg by mouth at bedtime.   Yes [provider]  Melatonin 5 MG CHEW Chew 1 tablet by mouth at bedtime as needed (Sleep).    Yes [provider]  omeprazole (PRILOSEC) 40 MG capsule Take 40 mg by mouth daily as needed (Acid reflux).    Yes [provider]  psyllium  (METAMUCIL) 58.6 % powder Take 1 packet by mouth daily as needed (GI issues).    Yes [provider]   Physical Exam: Vitals:   07/27/19 0000 07/27/19 0100 07/27/19 0200 07/27/19 0300  BP: (!) 145/113 140/60 (!) 141/64 113/76  Pulse: (!) 57 (!) 58 (!) 58 (!) 59  Resp: 19 19 15 17   Temp:      TempSrc:      SpO2: 96% 95% 92% 94%  Weight:      Height:        Wt Readings from Last 3 Encounters:  07/26/19 61.7 kg  06/17/19 61.2 kg  11/30/18 61.3 kg    . General:  Appears calm and comfortable. AAOx4.  . Eyes: EOMI, normal lids, irises & conjunctiva . ENT: grossly normal hearing, lips & tongue . Neck: normal ROM . Cardiovascular: RRR, no m/r/g. No LE edema. . Chest Wall: Non-tender to palpation . Respiratory: CTA bilaterally, no w/r/r. Normal respiratory effort. . Abdomen: soft, ntnd . Skin: no rash or induration seen on limited exam . Musculoskeletal: grossly normal tone BUE/BLE . Psychiatric: grossly normal mood and affect, speech fluent and appropriate . Neurologic: grossly non-focal.          Labs on Admission:  Basic Metabolic Panel: Recent Labs  Lab 07/26/19 1610  NA 138  K 4.3  CL 104  CO2 25  GLUCOSE 103*  BUN 20  CREATININE 0.81  CALCIUM 9.2   Liver Function Tests: No results for input(s): AST, ALT, ALKPHOS, BILITOT, PROT, ALBUMIN in the last 168 hours. No results for input(s): LIPASE, AMYLASE in the last 168 hours. No results for input(s): AMMONIA in the last 168 hours. CBC: Recent Labs  Lab 07/26/19 1610  WBC 9.3  HGB 12.8  HCT 39.4  MCV 96.6  PLT 329   Cardiac Enzymes: No results for input(s): CKTOTAL, CKMB, CKMBINDEX, TROPONINI in the last 168 hours.  BNP (last 3 results) Recent Labs    07/26/19 2348  BNP 80.3    ProBNP (last 3 results) No results for input(s): PROBNP in the last 8760 hours.  CBG: No results for input(s): GLUCAP in the last 168 hours.  Radiological Exams on Admission: DG Chest 2 View  Result Date:  07/26/2019 CLINICAL DATA:  Shortness of breath EXAM: CHEST - 2 VIEW COMPARISON:  None. FINDINGS: Frontal and lateral views of the chest demonstrate an unremarkable cardiac silhouette. No airspace disease, effusion, or pneumothorax. Background interstitial prominence consistent with history of tobacco abuse. IMPRESSION: 1. No acute intrathoracic process. Electronically Signed   By: 07/28/2019 M.D.   On: 07/26/2019 16:58    EKG: Independently reviewed. HR 70. NSR. QTc 408. No STEMI.  Assessment/Plan Principal Problem:   Chest pain Active Problems:   Mixed hyperlipidemia   Atrial fibrillation (HCC)   Essential hypertension   Tobacco use  79 y.o. female with PMH of PAF on Eliquis, HTN and former smoker presents to ED with chest pain and admitted for ACS rule-out.  Chest Pain ACS Rule-out - pain over lower left anterior chest intermittently for last few days -  Tele - Trend Trops - Heart Score: 5 - Aspirin ordered on admission - NPO for now - Cards consult PRN - D-dimer age adjusted normal   Paroxysmal atrial fibrillation - CHA2DS2VAsc score 4, annual stroke risk 5%. - Continue eliquis 5 mg bid. Avoid NSAIDS - Mag ordered on admission   Hypertension - Continue lisinopril 10 mg daily  Mild to moderate MR, mild TR - Per Cards: Recommended repeat echocardiogram in 04/2020  Exertional dyspnea - Per Cards: "Cardiac workup is under whelming for her symptoms. Given her h/o smoking, consider pulmonary function testing."  - CT Chest has been ordered already outpatient - Encourage smoking cessation  Code Status: Full DVT Prophylaxis: PTA Eliquis Family Communication: None; attempted to call daughter Judeen Hammans and no answer Disposition Plan: Admit under observation. Patient with risk factors requiring further monitoring of chest pain. If labs remain normal, anticipate discharge home later today or tomorrow. Patient is at high risk for further decompensation due to age and  co-morbidities.   Time spent: 50 minutes  Chauncey Mann, MD [facility] Pager [phone]

## 2019-07-27 NOTE — ED Notes (Signed)
Pt verbalizes use of IS and demonstrated  Use of IS.

## 2019-07-29 NOTE — Progress Notes (Signed)
Primary Physician/Referring:  [name], MD  Patient ID: Jill Jackson, female    DOB: June 24, 1940, 79 y.o.   MRN: [id number]  Chief Complaint  Patient presents with  . Chest Pain    Hospital follow up  . Shortness of Breath  . Atrial Fibrillation   HPI:    Jill Jackson  is a 79 y.o. Caucasian female with hypertension, hyperlipidemia, former smoker, paroxysmal atrial fibrillation. She is here today for follow up after a ED visit on 07/26/2019 for left sided chest pain and shortness of breath. D-Dimer minimally elevated and CTA chest revealed severe 3 vessel coronary calcification and also aortic atherosclerosis.  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  She now presents here for follow-up of chest pain, on further questioning she has heartburn and frequent burping at rest and sometimes with activity which is improved with omeprazole OTC however she is also noticed over the past 2 months exertional chest tightness associated with marked dyspnea and is easily relieved with rest but has been getting more frequent.  Denies leg edema, hemoptysis, recent weight changes, PND or orthopnea.  Past Medical History:  Diagnosis Date  . Atrial fibrillation ([place]) 11/01/2018  . HLD (hyperlipidemia) 11/01/2018   Past Surgical History:  Procedure Laterality Date  . TUBAL LIGATION  1977   Family History  Problem Relation Age of Onset  . Stroke Mother   . Stroke Brother     Social History   Tobacco Use  . Smoking status: Light Tobacco Smoker    Packs/day: 1.00    Years: 25.00    Pack years: 25.00    Types: Cigarettes  . Smokeless tobacco: Never Used  . Tobacco comment: 1-2 cigarettes a day. Has 25-26 pack year history  Substance Use Topics  . Alcohol use: Yes    Comment: wine once a year   Marital Status: Widowed  ROS  Review of Systems    Cardiovascular: Positive for chest pain and dyspnea on exertion. Negative for leg swelling.  Musculoskeletal: Positive for back pain.  Gastrointestinal: Positive for heartburn.   Objective  Blood pressure (!) 145/60, pulse 68, temperature 98.7 F (37.1 C), temperature source Temporal, resp. rate 15, height 5' 3"  (1.6 m), weight 134 lb (60.8 kg), SpO2 97 %.  Vitals with BMI 07/30/2019 07/27/2019 07/27/2019  Height 5' 3"  - -  Weight 134 lbs - -  BMI 76.16 - -  Systolic 073 710 626  Diastolic 60 63 60  Pulse 68 77 65     Physical Exam  Constitutional: She appears well-developed and well-nourished.  Cardiovascular: Normal rate, regular rhythm and intact distal pulses. Exam reveals no gallop.  No murmur heard. Pulses:      Carotid pulses are on the right side with bruit and on the left side with bruit.      Femoral pulses are 2+ on the right side and 2+ on the left side.      Dorsalis pedis pulses are 1+ on the right side and 1+ on the left side.       Posterior tibial pulses are 1+ on the right side and 1+ on the left side.  No JVD. No pedal edema.  Pulmonary/Chest: Effort normal and breath sounds normal. No accessory muscle usage. No respiratory distress.  Abdominal: Soft. Bowel sounds are normal.   Laboratory examination:   Recent Labs  07/26/19 1610  NA 138  K 4.3  CL 104  CO2 25  GLUCOSE 103*  BUN 20  CREATININE 0.81  CALCIUM 9.2  GFRNONAA >60  GFRAA >60   estimated creatinine clearance is 46.6 mL/min (by C-G formula based on SCr of 0.81 mg/dL).  CMP Latest Ref Rng & Units 07/26/2019  Glucose 70 - 99 mg/dL 103(H)  BUN 8 - 23 mg/dL 20  Creatinine 0.44 - 1.00 mg/dL 0.81  Sodium 135 - 145 mmol/L 138  Potassium 3.5 - 5.1 mmol/L 4.3  Chloride 98 - 111 mmol/L 104  CO2 22 - 32 mmol/L 25  Calcium 8.9 - 10.3 mg/dL 9.2   CBC Latest Ref Rng & Units 07/26/2019  WBC 4.0 - 10.5 K/uL 9.3  Hemoglobin 12.0 - 15.0 g/dL 12.8  Hematocrit 36.0 - 46.0 % 39.4  Platelets 150 - 400  K/uL 329   Lipid Panel  No results found for: CHOL, TRIG, HDL, CHOLHDL, VLDL, LDLCALC, LDLDIRECT HEMOGLOBIN A1C No results found for: HGBA1C, MPG TSH No results for input(s): TSH in the last 8760 hours.  External labs:   11/23/2018: Glucose 93. BUN/Cr 15/0.78. eGFT 73. Na/K 140/4.8.  Chol 154, TG 106, HDL 70, LDL 63.  10/18/2018: Glucose 80. BUN/Cr 16/0.72. eGFR 80/92. Na/K 142/4.3. Rest of the CMP normal. H/H 13/40. MCV 96. TSH 5.5 High  Medications and allergies   Allergies  Allergen Reactions  . Tetracyclines & Related Nausea And Vomiting     Current Outpatient Medications  Medication Instructions  . acetaminophen (TYLENOL) 500 mg, Oral, 2 times daily PRN  . alendronate (FOSAMAX) 70 MG tablet 1 tablet, Oral, Every Wed, Drink a glass of water 30 minutes before medication and sit upright for 30 minutes.  Marland Kitchen amLODipine (NORVASC) 5 mg, Oral, Daily  . apixaban (ELIQUIS) 5 mg, Oral, 2 times daily  . atorvastatin (LIPITOR) 10 mg, Oral, Daily  . bismuth subsalicylate (PEPTO BISMOL) 262 MG/15ML suspension 15 mLs, Oral, Every 6 hours PRN  . diphenhydrAMINE (BENADRYL) 25 mg, Oral, Every 6 hours PRN  . diphenhydrAMINE-APAP, sleep, (TYLENOL PM EXTRA STRENGTH PO) 0.5 tablets, Oral, At bedtime PRN  . latanoprost (XALATAN) 0.005 % ophthalmic solution 1 drop, Both Eyes, Daily at bedtime  . metoprolol tartrate (LOPRESSOR) 25 mg, Oral, 2 times daily  . nitroGLYCERIN (NITROSTAT) 0.4 mg, Sublingual, Every 5 min PRN  . omeprazole (PRILOSEC) 20 mg, Oral, Daily before breakfast  . psyllium (METAMUCIL) 58.6 % powder 1 packet, Oral, Daily PRN   Radiology:   CT Angio Chest 07/27/2019: 1. No evidence of acute abnormality. No evidence of pulmonary emboli. 2. Coronary artery disease calcification of all 3 coronary arteries. 3. Aortic Atherosclerosis and centrilobular Emphysema.  Cardiac Studies:   Lexiscan Myoview stress test 11/19/2018: Lexiscan stress test was performed. Stress EKG is  non-diagnostic, as this is pharmacological stress test. Myocardial perfusion imaging is normal. LVEF 68%. Low risk study.  Echocardiogram 11/23/2018:  Left ventricle cavity is normal in size. Normal left ventricular wall thickness. Normal LV systolic function with EF 61%. Normal global wall motion. Doppler evidence of grade I (impaired) diastolic dysfunction, normal LAP.  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 Mild tricuspid regurgitation. Estimated pulmonary artery systolic pressure is 28 mmHg. Estimated RA pressure 8 mmHg.  EKG  ED EKG 07/27/2019: Normal sinus rhythm at rate of 64 bpm, borderline criteria for left atrial enlargement, leftward axis.  Poor R wave progression, cannot exclude anteroseptal infarct old.  No evidence of ischemia, normal QT interval.  06/17/2019: Atrial rhythm 57 bpm. Nonspecific ST abnormalities.   10/18/2018: Atrial fibrillation with RVR 122 bpm. Low voltage.   Assessment     ICD-10-CM   1. Angina pectoris (HCC)  I20.9 metoprolol tartrate (LOPRESSOR) 25 MG tablet    nitroGLYCERIN (NITROSTAT) 0.4 MG SL tablet    PCV MYOCARDIAL PERFUSION WITH LEXISCAN    PCV ECHOCARDIOGRAM COMPLETE    amLODipine (NORVASC) 5 MG tablet  2. Coronary artery calcification seen on CAT scan  I25.10 PCV MYOCARDIAL PERFUSION WITH LEXISCAN  3. Exertional dyspnea  R06.00 PCV ECHOCARDIOGRAM COMPLETE  4. Centrilobular emphysema ([place])  J43.2   5. Paroxysmal atrial fibrillation ([place]). CHA2DS2-VASc Score is 5. Yearly risk of stroke: 6.7% (A, F, HTN, Vasc Dz).     I48.0   6. Mixed hyperlipidemia  E78.2 atorvastatin (LIPITOR) 10 MG tablet    Lipid Panel With LDL/HDL Ratio  7. Bilateral carotid bruits  R09.89 PCV CAROTID DUPLEX (BILATERAL)  8. Tobacco use disorder  F17.200   9. Essential hypertension  I10 amLODipine (NORVASC) 5 MG tablet     Meds ordered this encounter  Medications  . metoprolol tartrate (LOPRESSOR) 25 MG tablet    Sig: Take 1 tablet  (25 mg total) by mouth 2 (two) times daily.    Dispense:  60 tablet    Refill:  2  . nitroGLYCERIN (NITROSTAT) 0.4 MG SL tablet    Sig: Place 1 tablet (0.4 mg total) under the tongue every 5 (five) minutes as needed for up to 25 days for chest pain.    Dispense:  25 tablet    Refill:  3  . atorvastatin (LIPITOR) 10 MG tablet    Sig: Take 1 tablet (10 mg total) by mouth daily.    Dispense:  30 tablet    Refill:  2  . amLODipine (NORVASC) 5 MG tablet    Sig: Take 1 tablet (5 mg total) by mouth daily.    Dispense:  30 tablet    Refill:  2    Discontinue Diltiazem    Medications Discontinued During This Encounter  Medication Reason  . lovastatin (MEVACOR) 20 MG tablet Change in therapy  . diltiazem (CARDIZEM CD) 180 MG 24 hr capsule Change in therapy    Recommendations:   Leata Dominy  is a 79 y.o. Caucasian female with hypertension, hyperlipidemia, former smoker, paroxysmal atrial fibrillation. She is here today for follow up after a ED visit on 07/26/2019 for left sided chest pain and shortness of breath. D-Dimer minimally elevated and CTA chest revealed severe 3 vessel coronary calcification and also aortic atherosclerosis.  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  Patient clearly has exertional chest pain that is new and has been getting worse with worsening dyspnea on exertion suggestive of angina pectoris and also has symptoms of GERD.  CT scan of the chest performed in the emergency room due to elevated D-dimer was evaluated, she has severe three-vessel coronary calcification and suspicion for significant CAD. Discontinue diltiazem and switched her to amlodipine for angina and also for hypertension and added metoprolol 25 mg p.o. twice daily for paroxysmal atrial fibrillation and also hypertension and CAD. S/L NTG was prescribed and explained how to and when  to use it and to notify us if there is change in frequency of use.  I reviewed her emergency room EKG independently.  Advised her to continue taking omeprazole on  a daily basis, she has discontinued this.  Lipids not at goal. I discontinued lovastatin and switched her to atorvastatin 10 mg daily, will obtain lipid profile testing in 6 weeks. Blood pressure is not well controlled, hopefully medication changes made above will improve this.  She also has a very prominent left carotid bruit, will obtain carotid duplex. Schedule for a Lexiscan Sestamibi stress test to evaluate for myocardial ischemia. Patient unable to do treadmill stress testing due to dyspnea and back pain.  With regard to atrial fibrillation, she is maintaining sinus rhythm, continue Eliquis.  CBC has remained stable and renal function is remained stable.  Adrian Prows, MD, [facility] 07/30/2019, 10:31 PM [facility]. [place] Office: [phone]

## 2019-07-30 ENCOUNTER — Other Ambulatory Visit: Payer: Self-pay

## 2019-07-30 ENCOUNTER — Encounter: Payer: Self-pay | Admitting: Cardiology

## 2019-07-30 ENCOUNTER — Ambulatory Visit: Payer: Medicare PPO | Admitting: Cardiology

## 2019-07-30 VITALS — BP 145/60 | HR 68 | Temp 98.7°F | Resp 15 | Ht 63.0 in | Wt 134.0 lb

## 2019-07-30 DIAGNOSIS — I209 Angina pectoris, unspecified: Secondary | ICD-10-CM

## 2019-07-30 DIAGNOSIS — F17200 Nicotine dependence, unspecified, uncomplicated: Secondary | ICD-10-CM

## 2019-07-30 DIAGNOSIS — E782 Mixed hyperlipidemia: Secondary | ICD-10-CM

## 2019-07-30 DIAGNOSIS — J432 Centrilobular emphysema: Secondary | ICD-10-CM

## 2019-07-30 DIAGNOSIS — I10 Essential (primary) hypertension: Secondary | ICD-10-CM

## 2019-07-30 DIAGNOSIS — I2510 Atherosclerotic heart disease of native coronary artery without angina pectoris: Secondary | ICD-10-CM

## 2019-07-30 DIAGNOSIS — I480 Paroxysmal atrial fibrillation: Secondary | ICD-10-CM

## 2019-07-30 DIAGNOSIS — R0989 Other specified symptoms and signs involving the circulatory and respiratory systems: Secondary | ICD-10-CM

## 2019-07-30 DIAGNOSIS — R0609 Other forms of dyspnea: Secondary | ICD-10-CM

## 2019-07-30 MED ORDER — METOPROLOL TARTRATE 25 MG PO TABS: 25.0000 mg | ORAL_TABLET | Freq: Two times a day (BID) | ORAL | 2 refills | Status: DC

## 2019-07-30 MED ORDER — NITROGLYCERIN 0.4 MG SL SUBL: 0.4000 mg | SUBLINGUAL_TABLET | SUBLINGUAL | 3 refills | Status: AC | PRN

## 2019-07-30 MED ORDER — AMLODIPINE BESYLATE 5 MG PO TABS: 5.0000 mg | ORAL_TABLET | Freq: Every day | ORAL | 2 refills | Status: DC

## 2019-07-30 MED ORDER — ATORVASTATIN CALCIUM 10 MG PO TABS: 10.0000 mg | ORAL_TABLET | Freq: Every day | ORAL | 2 refills | Status: DC

## 2019-07-30 NOTE — Patient Instructions (Signed)
End of June get blood work done and see Dr. Rosemary Holms.  Tests will be discussed once done.   Quit smoking

## 2019-08-02 ENCOUNTER — Telehealth: Payer: Self-pay

## 2019-08-02 NOTE — Telephone Encounter (Signed)
Patient called stating she was having some back pain and has tried over the counter medications, and was previously prescribed Naproxen from her PCP. Patient stated she would like something stronger. I encouraged the patient to contact her PCP in regards to a pain medication because that is not a medication our office usually prescribes. Patient voiced understanding.

## 2019-08-06 ENCOUNTER — Other Ambulatory Visit: Payer: Self-pay

## 2019-08-06 DIAGNOSIS — E782 Mixed hyperlipidemia: Secondary | ICD-10-CM

## 2019-08-11 ENCOUNTER — Encounter: Payer: Self-pay | Admitting: Cardiology

## 2019-08-13 ENCOUNTER — Other Ambulatory Visit: Payer: Self-pay

## 2019-08-13 ENCOUNTER — Other Ambulatory Visit (HOSPITAL_COMMUNITY): Payer: Self-pay

## 2019-08-13 ENCOUNTER — Ambulatory Visit: Payer: Medicare PPO

## 2019-08-13 DIAGNOSIS — R0989 Other specified symptoms and signs involving the circulatory and respiratory systems: Secondary | ICD-10-CM

## 2019-08-13 DIAGNOSIS — R0609 Other forms of dyspnea: Secondary | ICD-10-CM

## 2019-08-13 DIAGNOSIS — I209 Angina pectoris, unspecified: Secondary | ICD-10-CM

## 2019-08-14 NOTE — Progress Notes (Signed)
Mod MR and mild TR, new from previous. Will discuss on OV

## 2019-08-17 ENCOUNTER — Emergency Department (HOSPITAL_COMMUNITY): Payer: Medicare PPO

## 2019-08-17 ENCOUNTER — Emergency Department (HOSPITAL_COMMUNITY)
Admission: EM | Admit: 2019-08-17 | Discharge: 2019-08-17 | Disposition: A | Discharge status: Home / Self Care | Payer: Medicare PPO | Attending: Emergency Medicine | Admitting: Emergency Medicine

## 2019-08-17 ENCOUNTER — Encounter (HOSPITAL_COMMUNITY): Payer: Self-pay | Admitting: *Deleted

## 2019-08-17 ENCOUNTER — Other Ambulatory Visit: Payer: Self-pay

## 2019-08-17 DIAGNOSIS — I10 Essential (primary) hypertension: Secondary | ICD-10-CM | POA: Diagnosis not present

## 2019-08-17 DIAGNOSIS — Z79899 Other long term (current) drug therapy: Secondary | ICD-10-CM | POA: Diagnosis not present

## 2019-08-17 DIAGNOSIS — Z7901 Long term (current) use of anticoagulants: Secondary | ICD-10-CM | POA: Diagnosis not present

## 2019-08-17 DIAGNOSIS — R531 Weakness: Secondary | ICD-10-CM

## 2019-08-17 DIAGNOSIS — F17210 Nicotine dependence, cigarettes, uncomplicated: Secondary | ICD-10-CM | POA: Diagnosis not present

## 2019-08-17 DIAGNOSIS — R0602 Shortness of breath: Secondary | ICD-10-CM | POA: Insufficient documentation

## 2019-08-17 HISTORY — DX: Essential (primary) hypertension: I10

## 2019-08-17 LAB — BASIC METABOLIC PANEL
Anion gap: 7 (ref 5–15)
BUN: 14 mg/dL (ref 8–23)
CO2: 24 mmol/L (ref 22–32)
Calcium: 8.7 mg/dL — ABNORMAL LOW (ref 8.9–10.3)
Chloride: 107 mmol/L (ref 98–111)
Creatinine, Ser: 0.82 mg/dL (ref 0.44–1.00)
GFR calc Af Amer: >60 mL/min (ref >60)
GFR calc non Af Amer: >60 mL/min (ref >60)
Glucose, Bld: 135 mg/dL — ABNORMAL HIGH (ref 70–99)
Potassium: 3.8 mmol/L (ref 3.5–5.1)
Sodium: 138 mmol/L (ref 135–145)

## 2019-08-17 LAB — CBC
HCT: 40.1 % (ref 36.0–46.0)
Hemoglobin: 13.1 g/dL (ref 12.0–15.0)
MCH: 32.3 pg (ref 26.0–34.0)
MCHC: 32.7 g/dL (ref 30.0–36.0)
MCV: 99 fL (ref 80.0–100.0)
Platelets: 287 10*3/uL (ref 150–400)
RBC: 4.05 MIL/uL (ref 3.87–5.11)
RDW: 13.1 % (ref 11.5–15.5)
WBC: 7.7 10*3/uL (ref 4.0–10.5)
nRBC: 0 % (ref 0.0–0.2)

## 2019-08-17 LAB — TROPONIN I (HIGH SENSITIVITY)
Troponin I (High Sensitivity): 11 ng/L (ref <18)
Troponin I (High Sensitivity): 8 ng/L (ref <18)

## 2019-08-17 MED ORDER — SODIUM CHLORIDE 0.9% FLUSH: 3.0000 mL | Freq: Once | INTRAVENOUS | Status: DC

## 2019-08-17 NOTE — ED Triage Notes (Signed)
THE PT IS C/O WEAKNESS SOB WITH EXERTION DIZZINESS since last pm  She has had a first pfizer vaccine. Hx af

## 2019-08-17 NOTE — Discharge Instructions (Addendum)
Please schedule an appointment both with your primary doctor as well as your cardiologist.  Please call our offices on Monday to ideally get close follow-up this coming week.  If you develop worsening chest pain, difficulty in breathing, fever, other new concerning symptom, return to ER for reassessment.

## 2019-08-17 NOTE — ED Notes (Signed)
Pt reassessed regarding Temp; pt. Asymptomatic. MD notified; ready for discharge per MD.

## 2019-08-18 NOTE — ED Provider Notes (Signed)
[facility] EMERGENCY DEPARTMENT Provider Note   CSN: [id number] Arrival date & time: 08/17/19  1839     History No chief complaint on file.   Jill Jackson is a 79 y.o. female.  Presents to ER with concern for generalized weakness, shortness of breath that is worse with exertion.  Has been having similar symptoms over the past few months, seem to be worsening over the past month or so, symptoms relatively constant since she was admitted to the hospital in April.  Today was supposed to go to granddaughter's wedding in [place] however she felt too weak, too little energy to be able to make it there.  Has been spending more time in bed lately.  Has not had any chest pain recently.  No fevers, cough.  No nausea, vomiting or abdominal pain.  Completed chart review, reviewed recent discharge summary, cardiology notes.  Past medical history A. fib on Eliquis, hypertension, smoking.  Last admission, dimer was slightly elevated and CTA chest was negative for pulmonary embolism but did show some coronary artery disease.  Patient was referred to cardiology will obtain echocardiogram and schedule patient for perfusion scan.  Echocardiogram have been reviewed by Dr. Jacinto Halim, noted new moderate MR and mild TR, grossly normal EF.  Stress test scheduled later this month. Pt reports appt after stress is compeleted.   HPI     Past Medical History:  Diagnosis Date  . Atrial fibrillation (HCC) 11/01/2018  . HLD (hyperlipidemia) 11/01/2018  . Hypertension     Patient Active Problem List   Diagnosis Date Noted  . Chest pain 07/27/2019  . Tobacco use 07/27/2019  . 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 Nonrheumatic mitral valve regurgitation 11/27/2018  . Essential hypertension 11/12/2018  . Mixed hyperlipidemia 11/01/2018  . Atrial fibrillation (HCC) 11/01/2018    Past Surgical History:  Procedure Laterality Date  . TUBAL LIGATION  1977     OB  History   No obstetric history on file.     Family History  Problem Relation Age of Onset  . Stroke Mother   . Stroke Brother     Social History   Tobacco Use  . Smoking status: Light Tobacco Smoker    Packs/day: 1.00    Years: 25.00    Pack years: 25.00    Types: Cigarettes  . Smokeless tobacco: Never Used  . Tobacco comment: 1-2 cigarettes a day. Has 25-26 pack year history  Substance Use Topics  . Alcohol use: Yes    Comment: wine once a year  . Drug use: Not Currently    Home Medications Prior to Admission medications   Medication Sig Start Date End Date Taking? Authorizing Provider  acetaminophen (TYLENOL) 500 MG tablet Take 500 mg by mouth 2 (two) times daily as needed for mild pain or headache.   Yes [provider]  alendronate (FOSAMAX) 70 MG tablet Take 70 mg by mouth every Wednesday. Drink a glass of water 30 minutes before medication and sit upright for 30 minutes. 07/09/19  Yes [provider]  amLODipine (NORVASC) 5 MG tablet Take 1 tablet (5 mg total) by mouth daily. 07/30/19 10/28/19 Yes Yates Decamp, MD  apixaban (ELIQUIS) 5 MG TABS tablet Take 1 tablet (5 mg total) by mouth 2 (two) times daily. 06/17/19  Yes Patwardhan, Manish J, MD  atorvastatin (LIPITOR) 10 MG tablet Take 1 tablet (10 mg total) by mouth daily. 07/30/19 10/28/19 Yes Yates Decamp, MD  bismuth subsalicylate (PEPTO BISMOL) 262 MG/15ML  suspension Take 15 mLs by mouth every 6 (six) hours as needed for indigestion.   Yes [provider]  diphenhydrAMINE-APAP, sleep, (TYLENOL PM EXTRA STRENGTH PO) Take 0.5 tablets by mouth at bedtime as needed (Sleep).    Yes [provider]  escitalopram (LEXAPRO) 10 MG tablet Take 5 mg by mouth daily.  08/14/19  Yes [provider]  HYDROcodone-acetaminophen (NORCO/VICODIN) 5-325 MG tablet Take 1 tablet by mouth every 4 (four) hours as needed for moderate pain.  08/05/19  Yes [provider]  latanoprost (XALATAN) 0.005 %  ophthalmic solution Place 1 drop into both eyes at bedtime.   Yes [provider]  MELATONIN PO Take 1 tablet by mouth daily as needed (for sleep).   Yes [provider]  metoprolol tartrate (LOPRESSOR) 25 MG tablet Take 1 tablet (25 mg total) by mouth 2 (two) times daily. 07/30/19 10/28/19 Yes Yates Decamp, MD  nitroGLYCERIN (NITROSTAT) 0.4 MG SL tablet Place 1 tablet (0.4 mg total) under the tongue every 5 (five) minutes as needed for up to 25 days for chest pain. 07/30/19 08/24/19 Yes Yates Decamp, MD  omeprazole (PRILOSEC) 20 MG capsule Take 20 mg by mouth daily before breakfast.    Yes [provider]  psyllium (METAMUCIL) 58.6 % powder Take 1 packet by mouth daily as needed (GI issues).    Yes [provider]    Allergies    Tetracyclines & related  Review of Systems   Review of Systems  Constitutional: Positive for fatigue. Negative for chills and fever.  HENT: Negative for ear pain and sore throat.   Eyes: Negative for pain and visual disturbance.  Respiratory: Positive for shortness of breath. Negative for cough.   Cardiovascular: Negative for chest pain and palpitations.  Gastrointestinal: Negative for abdominal pain and vomiting.  Genitourinary: Negative for dysuria and hematuria.  Musculoskeletal: Negative for arthralgias and back pain.  Skin: Negative for color change and rash.  Neurological: Negative for seizures and syncope.  All other systems reviewed and are negative.   Physical Exam Updated Vital Signs BP 125/62   Pulse (!) 57   Temp 99.4 F (37.4 C) (Oral)   Resp (!) 22   Ht 5\' 3"  (1.6 m)   Wt 59.9 kg   SpO2 95%   BMI 23.38 kg/m   Physical Exam Vitals and nursing note reviewed.  Constitutional:      General: She is not in acute distress.    Appearance: She is well-developed.  HENT:     Head: Normocephalic and atraumatic.  Eyes:     Conjunctiva/sclera: Conjunctivae normal.  Cardiovascular:     Rate and Rhythm: Normal rate  and regular rhythm.     Heart sounds: No murmur.  Pulmonary:     Effort: Pulmonary effort is normal. No respiratory distress.     Breath sounds: Normal breath sounds.  Abdominal:     Palpations: Abdomen is soft.     Tenderness: There is no abdominal tenderness.  Musculoskeletal:        General: No deformity or signs of injury.     Cervical back: Neck supple.  Skin:    General: Skin is warm and dry.  Neurological:     General: No focal deficit present.     Mental Status: She is alert and oriented to person, place, and time.  Psychiatric:        Mood and Affect: Mood normal.        Behavior: Behavior normal.  ED Results / Procedures / Treatments   Labs (all labs ordered are listed, but only abnormal results are displayed) Labs Reviewed  BASIC METABOLIC PANEL - Abnormal; Notable for the following components:      Result Value   Glucose, Bld 135 (*)    Calcium 8.7 (*)    All other components within normal limits  CBC  TROPONIN I (HIGH SENSITIVITY)  TROPONIN I (HIGH SENSITIVITY)    EKG EKG Interpretation  Date/Time:  Saturday Aug 17 2019 18:49:53 EDT Ventricular Rate:  53 PR Interval:  152 QRS Duration: 74 QT Interval:  438 QTC Calculation: 410 R Axis:   6 Text Interpretation: Sinus bradycardia Possible Anterior infarct , age undetermined Abnormal ECG Confirmed by Marianna Fuss ([id number]) on 08/17/2019 7:53:51 PM   Radiology DG Chest 2 View  Result Date: 08/17/2019 CLINICAL DATA:  Weakness, shortness of breath EXAM: CHEST - 2 VIEW COMPARISON:  07/26/2019 FINDINGS: Stable cardiomediastinal contours. Atherosclerotic calcification of the aortic knob. Hyperexpanded lungs. No focal airspace consolidation, pleural effusion, or pneumothorax. Degenerative changes of the thoracic spine. IMPRESSION: COPD without acute cardiopulmonary findings. Electronically Signed   By: Duanne Guess D.O.   On: 08/17/2019 19:22    Procedures Procedures (including critical care  time)  Medications Ordered in ED Medications  sodium chloride flush (NS) 0.9 % injection 3 mL (has no administration in time range)    ED Course  I have reviewed the triage vital signs and the nursing notes.  Pertinent labs & imaging results that were available during my care of the patient were reviewed by me and considered in my medical decision making (see chart for details).    MDM Rules/Calculators/A&P                      79 year old lady presenting to ER with concern for generalized weakness, episodes of shortness of breath.  On exam patient is well-appearing with normal vital signs, slight bradycardia but normal BP.  Lungs were clear, CXR negative for pneumonia.  No anemia on CBC, no leukocytosis.  Electrolytes grossly within normal limits, normal kidney function.  For similar symptoms patient had dimer evaluated which was borderline elevated and a CTA chest that was negative.  She reports compliance with her anticoagulation for her A. fib, given recent work-up and this, very low suspicion that patient's symptoms today could be from a pulmonary embolism.  Her EKG does not demonstrate acute ischemic changes, troponin x2 is within normal limits, no delta troponin, doubt ACS as etiology for her symptoms.  She is being worked up as an outpatient with Dr. Jacinto Halim and has stress scheduled as well as additional outpatient appointment. Recent echo did show new mod MR and mild TR while this may be component to clinical picture do not believe this entirely explains patient's symptoms. Ultimately, this symptoms seem to be more chronic and given patient's current clinical appearance, work-up today, believe she is stable and appropriate to continue with this current work-up as planned.  Discussed return precautions should patient have worsening of her condition or should she develop chest pain, reviewed both with patient and daughter at bedside in detail.  Discharged home.    After the discussed  management above, the patient was determined to be safe for discharge.  The patient was in agreement with this plan and all questions regarding their care were answered.  ED return precautions were discussed and the patient will return to the ED with any significant worsening of condition.  Final Clinical Impression(s) / ED Diagnoses Final diagnoses:  Shortness of breath  Weakness    Rx / DC Orders ED Discharge Orders    None       Lucrezia Starch, MD 08/18/19 1540

## 2019-08-19 ENCOUNTER — Other Ambulatory Visit: Payer: Medicare PPO

## 2019-08-26 ENCOUNTER — Other Ambulatory Visit: Payer: Self-pay

## 2019-08-26 ENCOUNTER — Ambulatory Visit: Payer: Medicare PPO

## 2019-08-26 DIAGNOSIS — I209 Angina pectoris, unspecified: Secondary | ICD-10-CM

## 2019-08-26 DIAGNOSIS — I2510 Atherosclerotic heart disease of native coronary artery without angina pectoris: Secondary | ICD-10-CM

## 2019-09-03 NOTE — Progress Notes (Signed)
Normal stress test. Hope she is feeling better. She has an appointment with MP soon

## 2019-09-04 NOTE — Progress Notes (Signed)
Called pt no answer left a vm to call back

## 2019-09-05 NOTE — Progress Notes (Signed)
2nd attempt : Called patient, she is still not feeling good, but will be in for her appt. On the 10th.

## 2019-09-12 ENCOUNTER — Ambulatory Visit: Payer: Medicare PPO | Admitting: Cardiology

## 2019-09-12 ENCOUNTER — Other Ambulatory Visit: Payer: Self-pay

## 2019-09-12 ENCOUNTER — Encounter: Payer: Self-pay | Admitting: Cardiology

## 2019-09-12 VITALS — BP 120/89 | HR 86 | Ht 63.0 in | Wt 128.0 lb

## 2019-09-12 DIAGNOSIS — E782 Mixed hyperlipidemia: Secondary | ICD-10-CM

## 2019-09-12 DIAGNOSIS — I10 Essential (primary) hypertension: Secondary | ICD-10-CM

## 2019-09-12 DIAGNOSIS — I480 Paroxysmal atrial fibrillation: Secondary | ICD-10-CM

## 2019-09-12 DIAGNOSIS — I25118 Atherosclerotic heart disease of native coronary artery with other forms of angina pectoris: Secondary | ICD-10-CM

## 2019-09-12 DIAGNOSIS — I2510 Atherosclerotic heart disease of native coronary artery without angina pectoris: Secondary | ICD-10-CM | POA: Insufficient documentation

## 2019-09-12 MED ORDER — AMLODIPINE BESYLATE 2.5 MG PO TABS: 2.5000 mg | ORAL_TABLET | Freq: Every day | ORAL | 2 refills | Status: DC

## 2019-09-12 MED ORDER — ATORVASTATIN CALCIUM 10 MG PO TABS: 10.0000 mg | ORAL_TABLET | Freq: Every day | ORAL | 2 refills | Status: DC

## 2019-09-12 NOTE — Progress Notes (Signed)
Patient referred by [name], * for atrial fibrilaltion  Subjective:   Jill Jackson, female    DOB: [birth year], 79 y.o.   MRN: [id number]   Chief Complaint  Patient presents with  . Coronary Artery Disease  . Chest Pain  . Hypertension  . Hyperlipidemia  . Atrial Fibrillation  . Follow-up    HPI  79 y.o. Caucasian female with hypertension, former smoker, paroxysmal atrial fibrillation, exertional dyspnea.  Recently, patient has had worsening dyspnea, Echocardiogram showed mitral regurgitation, PH. No ischemia on stress test.   Workup shoed EF 61%, grade 1 DD, mild myxomatous degeneration with mild to moderate mitral regurgitation, mild tricuspid regurgitation, estimated PASP 28 mmHg. Lexiscan nuclear stress test did not show ischemia/infarction. IU suspected her exertional dyspnea was more likely due to history of smoking and possible COPD.  In the last few weeks, patient has had recurrent ED and hospital admissions with several complaints.  While she does have episodes of exertional chest pain, these are far and few between.  Her predominant symptoms are generalized fatigue, generalized pain, and exertional dyspnea.  She was started on amlodipine for antianginal treatment, along with Lipitor as lipid-lowering treatment.  She stopped both his medication suspecting these may be the etiology for her symptoms.  However, her symptoms have not improved.  Current Outpatient Medications on File Prior to Visit  Medication Sig Dispense Refill  . acetaminophen (TYLENOL) 500 MG tablet Take 500 mg by mouth 2 (two) times daily as needed for mild pain or headache.    Marland Kitchen apixaban (ELIQUIS) 5 MG TABS tablet Take 1 tablet (5 mg total) by mouth 2 (two) times daily. 180 tablet 3  . atorvastatin (LIPITOR) 10 MG tablet Take 1 tablet (10 mg total) by mouth daily. 30 tablet 2  . bismuth subsalicylate (PEPTO BISMOL) 262 MG/15ML suspension Take 15 mLs by mouth every 6 (six) hours  as needed for indigestion.    . diphenhydrAMINE-APAP, sleep, (TYLENOL PM EXTRA STRENGTH PO) Take 0.5 tablets by mouth at bedtime as needed (Sleep).     Marland Kitchen escitalopram (LEXAPRO) 10 MG tablet Take 5 mg by mouth daily.     Marland Kitchen HYDROcodone-acetaminophen (NORCO/VICODIN) 5-325 MG tablet Take 1 tablet by mouth every 4 (four) hours as needed for moderate pain.     Marland Kitchen latanoprost (XALATAN) 0.005 % ophthalmic solution Place 1 drop into both eyes at bedtime.    Marland Kitchen MELATONIN PO Take 1 tablet by mouth daily as needed (for sleep).    . metoprolol tartrate (LOPRESSOR) 25 MG tablet Take 1 tablet (25 mg total) by mouth 2 (two) times daily. 60 tablet 2  . nitroGLYCERIN (NITROSTAT) 0.4 MG SL tablet Place 1 tablet (0.4 mg total) under the tongue every 5 (five) minutes as needed for up to 25 days for chest pain. 25 tablet 3  . omeprazole (PRILOSEC) 20 MG capsule Take 20 mg by mouth daily before breakfast.     . psyllium (METAMUCIL) 58.6 % powder Take 1 packet by mouth daily as needed (GI issues).      No current facility-administered medications on file prior to visit.    Cardiovascular studies:  EKG 09/12/2019: Afib 94 bpm Poor R wave progression  Lexiscan/modified Bruce Tetrofosmin stress test 08/26/2019: Lexiscan/modified Bruce nuclear stress test performed using 1-day protocol. Stress EKG is non-diagnostic, as this is pharmacological stress test. In addition, stress EKG at 79% MPHR showed sinus tachycardia, <1 mm ST depression in inferolateral leads.  Normal myocardial perfusion. Stress LVEF 62%.  Low risk study.   Echocardiogram 08/13/2019:  Normal LV systolic function with visual EF 55-60%. Left ventricle cavity  is normal in size. Mild left ventricular hypertrophy. Normal global wall  motion. Normal diastolic filling pattern, normal LAP.  Mild calcification of the mitral valve annulus. Mild mitral valve leaflet  thickening. Mildly restricted mitral valve leaflets. Moderate (Grade II)  mitral regurgitation.   Mild tricuspid regurgitation.  Mild pulmonary hypertension. RVSP measures 37 mmHg.  Mild pulmonic regurgitation.  IVC is dilated with respiratory variation.  Compared to prior study 11/23/2018 MR is now moderate and pulmonary  hypertension is new.   Carotid artery duplex 08/13/2019:  Minimal stenosis in the right internal carotid artery (1-15%).  Stenosis in the left internal carotid artery (16-49%).  Antegrade right vertebral artery flow. Antegrade left vertebral artery  flow.  Follow up in one year is appropriate if clinically indicated.  CTA Chest 07/27/2019: 1. No evidence of acute abnormality. No evidence of pulmonary emboli. 2. Coronary artery disease. LAD and RCA coronary acalcifications 3. Aortic Atherosclerosis (ICD10-I70.0) and Emphysema (ICD10-J43.9).   EKG 06/17/2019: Atrial rhythm 57 bpm. Nonspecific ST abnormalities.  Echocardiogram 11/23/2018:  Left ventricle cavity is normal in size. Normal left ventricular wall thickness. Normal LV systolic function with EF 61%. Normal global wall motion. Doppler evidence of grade I (impaired) diastolic dysfunction, normal LAP.  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 Mild tricuspid regurgitation. Estimated pulmonary artery systolic pressure is 28 mmHg. Estimated RA pressure 8 mmHg.  EKG 11/12/2018: Sinus rhythm 81 bpm.   Left atrial enlargement.  Nonspecific ST depression.  EKG 10/18/2018: Atrial fibrillation with RVR 122 bpm. Low voltage.   Recent labs: 11/23/2018: Glucose 93. BUN/Cr 15/0.78. eGFT 73. Na/K 140/4.8.  Chol 154, TG 106, HDL 70, LDL 63.  10/18/2018: Glucose 80. BUN/Cr 16/0.72. eGFR 80/92. Na/K 142/4.3. Rest of the CMP normal. H/H 13/40. MCV 96. TSH 5.5 High   Review of Systems  Cardiovascular: Positive for dyspnea on exertion. Negative for chest pain, leg swelling, palpitations and syncope.  Respiratory: Positive for shortness of breath.   Musculoskeletal:  Positive for back pain.        Vitals:   09/12/19 1127  BP: 120/89  Pulse: 86  SpO2: 95%     Body mass index is 22.67 kg/m. Filed Weights   09/12/19 1127  Weight: 128 lb (58.1 kg)     Objective:   Physical Exam Vitals and nursing note reviewed.  Constitutional:      Appearance: She is well-developed.  Neck:     Vascular: No JVD.  Cardiovascular:     Rate and Rhythm: Normal rate and regular rhythm.     Pulses: Intact distal pulses.     Heart sounds: Normal heart sounds. No murmur heard.   Pulmonary:     Effort: Pulmonary effort is normal.     Breath sounds: Normal breath sounds. No wheezing or rales.        Assessment & Recommendations:   79 y.o. Caucasian female with hypertension, former smoker, paroxysmal atrial fibrillation, mild to moderate MR, mild TR, exertional chest pain, dyspnea, generalized fatigue  CAD: While she does have LAD and RCA calcification and very likely has multivessel coronary artery disease, her stress test does not show any ischemia.  Her episodes of chest pain are far and few in between, and certainly overshadowed by her more predominant complaints of generalized fatigue.  I do not think coronary revascularization would improve her fatigue symptoms.  I suspect her  fatigue could be related to her A. fib.  However, she has had recent blood in stool and is scheduled to undergo colonoscopy in the near future.  I gave her the following options.  Proceed with cardioversion now, followed by colonoscopy at least 4-6 weeks later for which anticoagulation will need to be interrupted.  Alternatively, proceed with colonoscopy now, followed by cardioversion in 4 to 6 weeks later, if her symptoms in A. fib still persist.  Patient is opted for the latter.  I also resumed amlodipine at 2.5 mg as antianginal therapy, along with Lipitor 10 mg.  Paroxysmal atrial fibrillation: Rate controlled.  Symptomatic.  See discussion above. CHA2DS2VAsc score 4,  annual stroke risk 5%. Continue eliquis 5 mg bid. Avoid NSAIDS.  Exertional dyspnea: Most likely etiology is COPD.  Moderate MR could also be contributing.  Clinically euvolemic.  Consider pulmonary function testing.    Hypertension:  Controlled  Time spent: 45-minute  Follow-up in 4 weeks   Jill Jackson Esther Hardy, MD [facility] Pager: [phone] Office: [phone] If no answer Cell [phone]

## 2019-09-16 ENCOUNTER — Ambulatory Visit: Payer: Medicare PPO | Admitting: Cardiology

## 2019-09-17 ENCOUNTER — Ambulatory Visit: Payer: Medicare PPO

## 2019-09-17 ENCOUNTER — Other Ambulatory Visit: Payer: Self-pay

## 2019-09-17 DIAGNOSIS — I480 Paroxysmal atrial fibrillation: Secondary | ICD-10-CM

## 2019-09-18 ENCOUNTER — Other Ambulatory Visit: Payer: Self-pay | Admitting: Physician Assistant

## 2019-09-18 DIAGNOSIS — R109 Unspecified abdominal pain: Secondary | ICD-10-CM

## 2019-10-03 ENCOUNTER — Ambulatory Visit
Admission: RE | Admit: 2019-10-03 | Discharge: 2019-10-03 | Disposition: A | Discharge status: Home / Self Care | Payer: Medicare PPO | Source: Ambulatory Visit | Attending: Physician Assistant | Admitting: Physician Assistant

## 2019-10-03 DIAGNOSIS — R109 Unspecified abdominal pain: Secondary | ICD-10-CM

## 2019-10-03 MED ORDER — IOPAMIDOL (ISOVUE-300) INJECTION 61%
100.0000 mL | Freq: Once | INTRAVENOUS | Status: AC | PRN
  Administered 2019-10-03: 100 mL via INTRAVENOUS

## 2019-10-16 ENCOUNTER — Ambulatory Visit: Payer: Medicare PPO | Admitting: Cardiology

## 2019-10-16 ENCOUNTER — Other Ambulatory Visit: Payer: Self-pay

## 2019-10-16 ENCOUNTER — Encounter: Payer: Self-pay | Admitting: Cardiology

## 2019-10-16 VITALS — BP 127/51 | HR 47 | Ht 63.0 in | Wt 123.4 lb

## 2019-10-16 DIAGNOSIS — R001 Bradycardia, unspecified: Secondary | ICD-10-CM | POA: Insufficient documentation

## 2019-10-16 DIAGNOSIS — I480 Paroxysmal atrial fibrillation: Secondary | ICD-10-CM

## 2019-10-16 MED ORDER — APIXABAN 2.5 MG PO TABS: 2.5000 mg | ORAL_TABLET | Freq: Two times a day (BID) | ORAL | 2 refills | Status: DC

## 2019-10-16 NOTE — Progress Notes (Signed)
Patient referred by [name], * for atrial fibrilaltion  Subjective:   Jill Jackson, female    DOB: December 20, 1940, 79 y.o.   MRN: [id number]   Chief Complaint  Patient presents with  . Atrial Fibrillation  . Fatigue    HPI  79 y.o. Caucasian female with hypertension, former smoker, paroxysmal atrial fibrillation, mild to moderate MR, mild TR, exertional chest pain, dyspnea, generalized fatigue  Recent GI workup was reportedly negative for any major bleeding issues.   She continues to feel tired and fatigued. She denies any obvious chest pain or shortness of breath.  Event monitor results discussed with the patient.   Current Outpatient Medications on File Prior to Visit  Medication Sig Dispense Refill  . acetaminophen (TYLENOL) 500 MG tablet Take 500 mg by mouth 2 (two) times daily as needed for mild pain or headache.    Marland Kitchen amLODipine (NORVASC) 2.5 MG tablet Take 1 tablet (2.5 mg total) by mouth daily. 30 tablet 2  . apixaban (ELIQUIS) 5 MG TABS tablet Take 1 tablet (5 mg total) by mouth 2 (two) times daily. 180 tablet 3  . atorvastatin (LIPITOR) 10 MG tablet Take 1 tablet (10 mg total) by mouth daily. 30 tablet 2  . bismuth subsalicylate (PEPTO BISMOL) 262 MG/15ML suspension Take 15 mLs by mouth every 6 (six) hours as needed for indigestion.    . diphenhydrAMINE-APAP, sleep, (TYLENOL PM EXTRA STRENGTH PO) Take 0.5 tablets by mouth at bedtime as needed (Sleep).     Marland Kitchen escitalopram (LEXAPRO) 10 MG tablet Take 5 mg by mouth daily.     Marland Kitchen HYDROcodone-acetaminophen (NORCO/VICODIN) 5-325 MG tablet Take 1 tablet by mouth every 4 (four) hours as needed for moderate pain.     Marland Kitchen latanoprost (XALATAN) 0.005 % ophthalmic solution Place 1 drop into both eyes at bedtime.    Marland Kitchen MELATONIN PO Take 1 tablet by mouth daily as needed (for sleep).    . metoprolol tartrate (LOPRESSOR) 25 MG tablet Take 1 tablet (25 mg total) by mouth 2 (two) times daily. 60 tablet 2  .  nitroGLYCERIN (NITROSTAT) 0.4 MG SL tablet Place 1 tablet (0.4 mg total) under the tongue every 5 (five) minutes as needed for up to 25 days for chest pain. 25 tablet 3  . omeprazole (PRILOSEC) 20 MG capsule Take 20 mg by mouth daily before breakfast.     . psyllium (METAMUCIL) 58.6 % powder Take 1 packet by mouth daily as needed (GI issues).      No current facility-administered medications on file prior to visit.    Cardiovascular studies:  EKG 10/16/2019: Marked sinus bradycardia 44 bppm  Event monitor 09/17/2019 - 09/30/2019: Diagnostic time: 99%  Dominant rhythm: Sinus. HR 44-136 bpm. Avg HR 63 bpm. Episodes of sustained Afib with RVR up to 136 bpm Afib burden 10% No atrial flutter/SVT/VT/high grade AV block, sinus pause >3sec noted. Manually triggered episodes were accidental push, correlated with Afib.   EKG 09/12/2019: Afib 94 bpm Poor R wave progression  Lexiscan/modified Bruce Tetrofosmin stress test 08/26/2019: Lexiscan/modified Bruce nuclear stress test performed using 1-day protocol. Stress EKG is non-diagnostic, as this is pharmacological stress test. In addition, stress EKG at 79% MPHR showed sinus tachycardia, <1 mm ST depression in inferolateral leads.  Normal myocardial perfusion. Stress LVEF 62%. Low risk study.   Echocardiogram 08/13/2019:  Normal LV systolic function with visual EF 55-60%. Left ventricle cavity  is normal in size. Mild left ventricular hypertrophy. Normal global wall  motion.  Normal diastolic filling pattern, normal LAP.  Mild calcification of the mitral valve annulus. Mild mitral valve leaflet  thickening. Mildly restricted mitral valve leaflets. Moderate (Grade II)  mitral regurgitation.  Mild tricuspid regurgitation.  Mild pulmonary hypertension. RVSP measures 37 mmHg.  Mild pulmonic regurgitation.  IVC is dilated with respiratory variation.  Compared to prior study 11/23/2018 MR is now moderate and pulmonary  hypertension is new.    Carotid artery duplex 08/13/2019:  Minimal stenosis in the right internal carotid artery (1-15%).  Stenosis in the left internal carotid artery (16-49%).  Antegrade right vertebral artery flow. Antegrade left vertebral artery  flow.  Follow up in one year is appropriate if clinically indicated.  CTA Chest 07/27/2019: 1. No evidence of acute abnormality. No evidence of pulmonary emboli. 2. Coronary artery disease. LAD and RCA coronary acalcifications 3. Aortic Atherosclerosis (ICD10-I70.0) and Emphysema (ICD10-J43.9).   EKG 06/17/2019: Atrial rhythm 57 bpm. Nonspecific ST abnormalities.  Echocardiogram 11/23/2018:  Left ventricle cavity is normal in size. Normal left ventricular wall thickness. Normal LV systolic function with EF 61%. Normal global wall motion. Doppler evidence of grade I (impaired) diastolic dysfunction, normal LAP.  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 Mild tricuspid regurgitation. Estimated pulmonary artery systolic pressure is 28 mmHg. Estimated RA pressure 8 mmHg.  EKG 11/12/2018: Sinus rhythm 81 bpm.   Left atrial enlargement.  Nonspecific ST depression.  EKG 10/18/2018: Atrial fibrillation with RVR 122 bpm. Low voltage.   Recent labs: 11/23/2018: Glucose 93. BUN/Cr 15/0.78. eGFT 73. Na/K 140/4.8.  Chol 154, TG 106, HDL 70, LDL 63.  10/18/2018: Glucose 80. BUN/Cr 16/0.72. eGFR 80/92. Na/K 142/4.3. Rest of the CMP normal. H/H 13/40. MCV 96. TSH 5.5 High   Review of Systems  Cardiovascular: Positive for dyspnea on exertion. Negative for chest pain, leg swelling, palpitations and syncope.  Respiratory: Positive for shortness of breath.   Musculoskeletal: Positive for back pain.        Vitals:   10/16/19 1637  BP: (!) 127/51  Pulse: (!) 47  SpO2: 96%     Body mass index is 21.86 kg/m. Filed Weights   10/16/19 1637  Weight: 123 lb 6.4 oz (56 kg)     Objective:   Physical Exam Vitals and  nursing note reviewed.  Constitutional:      Appearance: She is well-developed.  Neck:     Vascular: No JVD.  Cardiovascular:     Rate and Rhythm: Regular rhythm. Bradycardia present.     Pulses: Intact distal pulses.     Heart sounds: Normal heart sounds. No murmur heard.   Pulmonary:     Effort: Pulmonary effort is normal.     Breath sounds: Normal breath sounds. No wheezing or rales.        Assessment & Recommendations:   79 y.o. Caucasian female with hypertension, former smoker, paroxysmal atrial fibrillation, mild to moderate MR, mild TR, exertional chest pain, dyspnea, generalized fatigue  Generalized fatigue: Possibly as a result of metoprolol side effect, or due to marked bradycardia caused by metoprolol. Stopped metoprolol tartrate as a scheduled medication. She may use 1-2 tablets as needed in a day for episodes of palpitations.   CAD: While she does have LAD and RCA calcification and very likely has multivessel coronary artery disease, her stress test does not show any ischemia.   Paroxysmal atrial fibrillation: Rate controlled. 10% Afib burden without any significant symptoms Stopping metoprolol tartarate due to marked bradycardia.  CHA2DS2VAsc score 4, annual stroke risk  5%. Reduce eliquis to 2.5 mg bid, now that she is >80 yrs. Avoid NSAIDS. Recent mild gum bleeding likely benign.  Hypertension:  Controlled  F/u in 4 weeks   [place], MD [facility] Pager: [phone] Office: [phone] If no answer Cell [phone]

## 2019-10-17 ENCOUNTER — Telehealth: Payer: Self-pay

## 2019-10-17 NOTE — Telephone Encounter (Signed)
Discussed recommendation of reducing metoprolol to 12.5 mg bid. Patient agrees with this and verbalized understanding.

## 2019-10-17 NOTE — Telephone Encounter (Signed)
Patient complaining of shortness of breath and palpitations. Did not take metoprolol last night. She took metoprolol 25 mg at 11:45 AM this morning. Symptoms are starting to improve. She wants to know if she can be put on a lower dose of metoprolol twice daily not prn. To avoid this issue in the future. Bp is 115/97 and hr 107.

## 2019-10-17 NOTE — Telephone Encounter (Signed)
Recommend metoprolol 12.5 mg twice daily, instead of 25 mg twice daily that she was previously taking.   Thanks MJP

## 2019-10-26 ENCOUNTER — Emergency Department (HOSPITAL_COMMUNITY): Payer: Medicare PPO

## 2019-10-26 ENCOUNTER — Encounter (HOSPITAL_COMMUNITY): Payer: Self-pay

## 2019-10-26 ENCOUNTER — Emergency Department (HOSPITAL_COMMUNITY)
Admission: EM | Admit: 2019-10-26 | Discharge: 2019-10-26 | Disposition: A | Discharge status: Home / Self Care | Payer: Medicare PPO | Attending: Emergency Medicine | Admitting: Emergency Medicine

## 2019-10-26 DIAGNOSIS — I2510 Atherosclerotic heart disease of native coronary artery without angina pectoris: Secondary | ICD-10-CM | POA: Insufficient documentation

## 2019-10-26 DIAGNOSIS — R55 Syncope and collapse: Secondary | ICD-10-CM | POA: Diagnosis not present

## 2019-10-26 DIAGNOSIS — I10 Essential (primary) hypertension: Secondary | ICD-10-CM | POA: Diagnosis not present

## 2019-10-26 DIAGNOSIS — Z79899 Other long term (current) drug therapy: Secondary | ICD-10-CM | POA: Diagnosis not present

## 2019-10-26 DIAGNOSIS — Z20822 Contact with and (suspected) exposure to covid-19: Secondary | ICD-10-CM | POA: Diagnosis not present

## 2019-10-26 DIAGNOSIS — Z7901 Long term (current) use of anticoagulants: Secondary | ICD-10-CM | POA: Diagnosis not present

## 2019-10-26 DIAGNOSIS — F17210 Nicotine dependence, cigarettes, uncomplicated: Secondary | ICD-10-CM | POA: Diagnosis not present

## 2019-10-26 DIAGNOSIS — I4891 Unspecified atrial fibrillation: Secondary | ICD-10-CM

## 2019-10-26 DIAGNOSIS — I480 Paroxysmal atrial fibrillation: Secondary | ICD-10-CM | POA: Diagnosis not present

## 2019-10-26 LAB — HEPATIC FUNCTION PANEL
ALT: 16 U/L (ref 0–44)
AST: 18 U/L (ref 15–41)
Albumin: 3.6 g/dL (ref 3.5–5.0)
Alkaline Phosphatase: 60 U/L (ref 38–126)
Bilirubin, Direct: 0.1 mg/dL (ref 0.0–0.2)
Indirect Bilirubin: 0.6 mg/dL (ref 0.3–0.9)
Total Bilirubin: 0.7 mg/dL (ref 0.3–1.2)
Total Protein: 6.6 g/dL (ref 6.5–8.1)

## 2019-10-26 LAB — URINALYSIS, ROUTINE W REFLEX MICROSCOPIC
Bilirubin Urine: NEGATIVE
Glucose, UA: NEGATIVE mg/dL
Hgb urine dipstick: NEGATIVE
Ketones, ur: 20 mg/dL — AB
Leukocytes,Ua: NEGATIVE
Nitrite: NEGATIVE
Protein, ur: NEGATIVE mg/dL
Specific Gravity, Urine: 1.013 (ref 1.005–1.030)
pH: 7 (ref 5.0–8.0)

## 2019-10-26 LAB — CBC
HCT: 41.5 % (ref 36.0–46.0)
Hemoglobin: 13.3 g/dL (ref 12.0–15.0)
MCH: 31.7 pg (ref 26.0–34.0)
MCHC: 32 g/dL (ref 30.0–36.0)
MCV: 98.8 fL (ref 80.0–100.0)
Platelets: 299 10*3/uL (ref 150–400)
RBC: 4.2 MIL/uL (ref 3.87–5.11)
RDW: 12.7 % (ref 11.5–15.5)
WBC: 8 10*3/uL (ref 4.0–10.5)
nRBC: 0 % (ref 0.0–0.2)

## 2019-10-26 LAB — BASIC METABOLIC PANEL
Anion gap: 7 (ref 5–15)
BUN: 24 mg/dL — ABNORMAL HIGH (ref 8–23)
CO2: 27 mmol/L (ref 22–32)
Calcium: 8.8 mg/dL — ABNORMAL LOW (ref 8.9–10.3)
Chloride: 106 mmol/L (ref 98–111)
Creatinine, Ser: 0.83 mg/dL (ref 0.44–1.00)
GFR calc Af Amer: >60 mL/min (ref >60)
GFR calc non Af Amer: >60 mL/min (ref >60)
Glucose, Bld: 108 mg/dL — ABNORMAL HIGH (ref 70–99)
Potassium: 4.6 mmol/L (ref 3.5–5.1)
Sodium: 140 mmol/L (ref 135–145)

## 2019-10-26 LAB — TROPONIN I (HIGH SENSITIVITY)
Troponin I (High Sensitivity): 7 ng/L (ref <18)
Troponin I (High Sensitivity): 6 ng/L (ref <18)

## 2019-10-26 LAB — SARS CORONAVIRUS 2 BY RT PCR (HOSPITAL ORDER, PERFORMED IN ~~LOC~~ HOSPITAL LAB): SARS Coronavirus 2: NEGATIVE

## 2019-10-26 LAB — MAGNESIUM: Magnesium: 2.3 mg/dL (ref 1.7–2.4)

## 2019-10-26 MED ORDER — DILTIAZEM HCL-DEXTROSE 125-5 MG/125ML-% IV SOLN (PREMIX)
5.0000 mg/h | INTRAVENOUS | Status: DC
  Administered 2019-10-26: 5 mg/h via INTRAVENOUS
  Filled 2019-10-26: qty 125

## 2019-10-26 MED ORDER — METOPROLOL TARTRATE 25 MG PO TABS
12.5000 mg | ORAL_TABLET | Freq: Once | ORAL | Status: AC
  Administered 2019-10-26: 12.5 mg via ORAL
  Filled 2019-10-26: qty 1

## 2019-10-26 MED ORDER — SODIUM CHLORIDE 0.9 % IV BOLUS
500.0000 mL | Freq: Once | INTRAVENOUS | Status: AC
  Administered 2019-10-26: 500 mL via INTRAVENOUS

## 2019-10-26 MED ORDER — SODIUM CHLORIDE 0.9% FLUSH: 3.0000 mL | Freq: Once | INTRAVENOUS | Status: DC

## 2019-10-26 NOTE — ED Notes (Signed)
Pure wick applied but the pt reports that she cannot use it

## 2019-10-26 NOTE — ED Notes (Signed)
Pt feeling better

## 2019-10-26 NOTE — Consult Note (Signed)
CARDIOLOGY CONSULT NOTE  Patient ID: Jill Jackson MRN: [id number] DOB/AGE: 1940/06/29 79 y.o.  Admit date: 10/26/2019 Referring Physician: Jacalyn Lefevre, MD Primary Physician:  Kaleen Mask, MD Reason for Consultation: Atrial fibrillation with rapid ventricular rate  HPI:  Jill Jackson is a 79 y.o. female who presents with a chief complaint of " generalized weakness." She past medical history and cardiovascular risk factors include: Paroxysmal atrial fibrillation, hypertension, former smoker, mild to moderate MR, mild TR,  coronary artery calcification, hyperlipidemia, advanced age, postmenopausal female.  Patient is accompanied by her daughter Cordelia Pen at the time of evaluation of approximately 5 PM.  Cardiology was consulted for recommendations for atrial fibrillation management.  It appears the patient has been feeling generalized fatigue, tiredness, decreased energy for some time and has had an extensive cardiovascular and GI work-up in the past.  Patient states that she felt well in the morning but after taking her medication she just felt tired and fatigued and had cold sweats.  She is having shortness of breath and mild chest tightness and took 1 pill of nitro.  The nitroglycerin tablet resolved the chest discomfort but she also became more lightheaded and dizzy feeling like she would pass out.  EMS was called and patient was transferred to [facility].  Per EMR, she was orthostatic when EMS had arrived and was found to be in A. fib with RVR with a heart rate ranging between 120-145 bpm.  Since being present in the ER patient denies any chest pain at rest but does have chronic dyspnea.  Patient states that in the recent past her heart rate was actually low and her metoprolol was reduced to 12.5 mg p.o. twice daily.  Since her Covid vaccine back in March 2021 patient states that she has been feeling more tired, fatigued, decreased appetite, weight loss and has  not been to her baseline.  She has not taken her second Covid vaccination either.  Patient states that she has been to the hospital at least 3 times since April for similar symptoms.   ALLERGIES: Allergies  Allergen Reactions  . Tetracyclines & Related Nausea And Vomiting    PAST MEDICAL HISTORY: Past Medical History:  Diagnosis Date  . Atrial fibrillation (HCC) 11/01/2018  . HLD (hyperlipidemia) 11/01/2018  . Hypertension   Coronary artery calcification Aortic atherosclerosis Emphysema Mild valvular heart disease  PAST SURGICAL HISTORY: Past Surgical History:  Procedure Laterality Date  . TUBAL LIGATION  1977    FAMILY HISTORY: The patient family history includes Hypertension in her brother and sister; Kidney failure in her sister; Stroke in her brother and mother; Thyroid disease in her sister.   SOCIAL HISTORY:  The patient  reports that she has been smoking cigarettes. She has a 25.00 pack-year smoking history. She has never used smokeless tobacco. She reports previous alcohol use. She reports previous drug use.  MEDICATIONS:  . diltiazem (CARDIZEM) infusion 5 mg/hr (10/26/19 1556)   Current Outpatient Medications  Medication Instructions  . acetaminophen (TYLENOL) 500 mg, Oral, 2 times daily PRN  . amLODipine (NORVASC) 2.5 mg, Oral, Daily  . apixaban (ELIQUIS) 2.5 mg, Oral, 2 times daily  . atorvastatin (LIPITOR) 10 mg, Oral, Daily  . bismuth subsalicylate (PEPTO BISMOL) 262 MG/15ML suspension 15 mLs, Oral, Every 6 hours PRN  . diphenhydrAMINE-APAP, sleep, (TYLENOL PM EXTRA STRENGTH PO) 0.5 tablets, Oral, At bedtime PRN  . escitalopram (LEXAPRO) 5 mg, Oral, Daily  . HYDROcodone-acetaminophen (NORCO/VICODIN) 5-325 MG tablet 1 tablet, Oral, Every 4  hours PRN  . latanoprost (XALATAN) 0.005 % ophthalmic solution 1 drop, Both Eyes, Daily at bedtime  . MELATONIN PO 1 tablet, Oral, Daily PRN  . metoprolol tartrate (LOPRESSOR) 12.5 mg, Oral, 2 times daily, palpitations  .  nitroGLYCERIN (NITROSTAT) 0.4 mg, Sublingual, Every 5 min PRN  . omeprazole (PRILOSEC) 20 mg, Oral, Daily before breakfast  . psyllium (METAMUCIL) 58.6 % powder 1 packet, Oral, Daily PRN   Review of Systems  Constitutional: Positive for decreased appetite, malaise/fatigue and weight loss. Negative for chills and fever.  HENT: Negative for hoarse voice and nosebleeds.   Eyes: Negative for discharge, double vision and pain.  Cardiovascular: Positive for chest pain (prior to presentation, now chest pain free), dyspnea on exertion (chronic and stable) and near-syncope. Negative for claudication, leg swelling, orthopnea, palpitations, paroxysmal nocturnal dyspnea and syncope.  Respiratory: Positive for shortness of breath. Negative for hemoptysis.   Musculoskeletal: Negative for muscle cramps and myalgias.  Gastrointestinal: Negative for abdominal pain, constipation, diarrhea, hematemesis, hematochezia, melena, nausea and vomiting.  Neurological: Positive for light-headedness. Negative for dizziness.  All other systems reviewed and are negative.  PHYSICAL EXAM: Vitals with BMI 10/26/2019 10/26/2019 10/26/2019  Height - - -  Weight - - -  BMI - - -  Systolic 123 102 -  Diastolic 65 83 -  Pulse 76 170 92     Intake/Output Summary (Last 24 hours) at 10/26/2019 1752 Last data filed at 10/26/2019 1616 Gross per 24 hour  Intake 1000 ml  Output --  Net 1000 ml    Net IO Since Admission: 1,000 mL [10/26/19 1752] CONSTITUTIONAL: Appearing older than stated age, hemodynamically stable, no acute distress.  SKIN: Skin is warm and dry. No rash noted. No cyanosis. No pallor. No jaundice HEAD: Normocephalic and atraumatic.  EYES: No scleral icterus MOUTH/THROAT: Moist oral membranes.  NECK: No JVD present. No thyromegaly noted. No carotid bruits  LYMPHATIC: No visible cervical adenopathy.  CHEST Normal respiratory effort. No intercostal retractions  LUNGS: Decreased breath sounds bilaterally, no  stridor. No wheezes. No rales.  CARDIOVASCULAR: Irregularly irregular, positive S1-S2, soft holosystolic murmur heard at the left lower sternal border, no gallops or rubs. ABDOMINAL: Nonobese, soft, nontender, nondistended, positive bowel sounds in all 4 quadrants, no apparent ascites.  EXTREMITIES: No peripheral edema  HEMATOLOGIC: No significant bruising NEUROLOGIC: Oriented to person, place, and time. Nonfocal. Normal muscle tone.  PSYCHIATRIC: Normal mood and affect. Normal behavior. Cooperative  RADIOLOGY: DG Chest 2 View  Result Date: 10/26/2019 CLINICAL DATA:  Atrial fibrillation, shortness of breath for 3 months, hypertension EXAM: CHEST - 2 VIEW COMPARISON:  08/17/2019 FINDINGS: Normal heart size, mediastinal contours, and pulmonary vascularity. Atherosclerotic calcification aorta. 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 Linear scarring in lingula unchanged. No acute infiltrate, pleural effusion or pneumothorax. Bones demineralized. IMPRESSION: COPD changes with minimal lingular scarring. No acute abnormalities. Electronically Signed   By: Ulyses Southward M.D.   On: 10/26/2019 14:00   LABORATORY DATA: Lab Results  Component Value Date   WBC 8.0 10/26/2019   HGB 13.3 10/26/2019   HCT 41.5 10/26/2019   MCV 98.8 10/26/2019   PLT 299 10/26/2019    Recent Labs  Lab 10/26/19 1251  NA 140  K 4.6  CL 106  CO2 27  BUN 24*  CREATININE 0.83  CALCIUM 8.8*  PROT 6.6  BILITOT 0.7  ALKPHOS 60  ALT 16  AST 18  GLUCOSE 108*    Lipid Panel  No results found for: CHOL,  TRIG, HDL, CHOLHDL, VLDL, LDLCALC  BNP (last 3 results) Recent Labs    07/26/19 2348  BNP 80.3    HEMOGLOBIN A1C No results found for: HGBA1C, MPG  Cardiac Panel (last 3 results) No results for input(s): CKTOTAL, CKMB, RELINDX in the last 8760 hours.  Invalid input(s): TROPONINHS  No results found for: CKTOTAL, CKMB, CKMBINDEX   TSH No results for input(s): TSH in the last 8760  hours.   Scheduled Meds: . metoprolol tartrate  12.5 mg Oral Once  . sodium chloride flush  3 mL Intravenous Once   Continuous Infusions: . diltiazem (CARDIZEM) infusion 5 mg/hr (10/26/19 1556)   PRN Meds:.  CARDIAC DATABASE: EKG: 10/26/2019: Atrial fibrillation, rate 6 bpm, normal axis, poor R wave progression, without underlying injury pattern.  Compared to prior EKG sinus bradycardia transition to A. fib.  Echocardiogram: 08/13/2019:  Normal LV systolic function with visual EF 55-60%. Left ventricle cavity is normal in size. Mild left ventricular hypertrophy. Normal global wall motion. Normal diastolic filling pattern, normal LAP.  Mild calcification of the mitral valve annulus. Mild mitral valve leaflet  thickening. Mildly restricted mitral valve leaflets. Moderate (Grade II) mitral regurgitation.  Mild tricuspid regurgitation.  Mild pulmonary hypertension. RVSP measures 37 mmHg.  Mild pulmonic regurgitation.  IVC is dilated with respiratory variation.  Compared to prior study 11/23/2018 MR is now moderate and pulmonary  hypertension is new.   Stress Testing:  Lexiscan/modified Bruce Tetrofosmin stress test 08/26/2019: Lexiscan/modified Bruce nuclear stress test performed using 1-day protocol. Stress EKG is non-diagnostic, as this is pharmacological stress test. In addition, stress EKG at 79% MPHR showed sinus tachycardia, <1 mm ST depression in inferolateral leads.  Normal myocardial perfusion. Stress LVEF 62%. Low risk study.  Heart Catheterization: None  Carotid artery duplex 08/13/2019:  Minimal stenosis in the right internal carotid artery (1-15%).  Stenosis in the left internal carotid artery (16-49%).  Antegrade right vertebral artery flow. Antegrade left vertebral artery flow.  Follow up in one year is appropriate if clinically indicated.  Event monitor 09/17/2019 - 09/30/2019: Diagnostic time: 99%  Dominant rhythm: Sinus. HR 44-136 bpm. Avg HR 63 bpm. Episodes of  sustained Afib with RVR up to 136 bpm Afib burden 10% No atrial flutter/SVT/VT/high grade AV block, sinus pause >3sec noted. Manually triggered episodes were accidental push, correlated with Afib.  IMPRESSION & RECOMMENDATIONS: Arn MedalSylvia Elizabeth Westley is a 79 y.o. female whose past medical history and cardiovascular risk factors include: Paroxysmal atrial fibrillation, hypertension, former smoker, mild to moderate MR, mild TR,  coronary artery calcification, hyperlipidemia, advanced age, postmenopausal female.  Atrial fibrillation with rapid ventricular rate: Improving  Patient was found to be in RVR when EMS arrived.  She has responded well to Cardizem drip and her ventricular rate has improved but continues to be in A. Fib.  Recommending restarting home dose of Lopressor 12.5 mg p.o. twice daily and weaning her off of Cardizem drip.  If needed may increase Lopressor to 25 mg p.o. twice daily with holding parameters (hold if systolic blood pressure top number less than 100 mmHg or heart rate/pulse less than 60 bpm).  Continue oral anticoagulation for thromboembolic prophylaxis.  Patient appears to be symptomatic with her underlying atrial fibrillation and therefore discussed TEE cardioversion on Monday, October 28, 2019.  Patient states that this has been discussed in the past by her primary cardiologist but she has been reluctant to do so.  If patient chooses not to undergo cardioversion focus on rate control therapy with goal  heart rate less than 110 bpm.  Long-term oral anticoagulation:  Indication: Paroxysmal atrial fibrillation.  CHA2DS2-VASc SCORE is 4 which correlates to 4 % risk of stroke per year.  Currently on Eliquis and does not endorse evidence of bleeding.  Continue to monitor.  Recent hemoglobin stable  Shortness of breath: Improving.   Multifactorial due to A. fib with RVR, underlying COPD/emphysema, and valvular heart disease.  Atypical chest discomfort:   Prior to  ER visit chest discomfort was atypical and may have been secondary to rapid ventricular rate.    No discomfort since she has been in the ER.  High sensitive troponin negative x2 and EKG at admission did not show injury pattern.    Patient also had a recent nuclear stress test as noted above.   Discussed with patient and her daughter that if her symptoms continue should discuss additional testing (ie left heart catheterization) with her primary cardiologist as outpatient.   Near syncope: Most likely secondary to A. fib with RVR and orthostatic hypotension.  Recommend gentle fluid hydration.  Recheck orthostatic vital signs once IV fluid hydration is complete  Hold amlodipine 2.5 mg p.o. daily for now until she is reevaluated either by her PCP or Dr. Truett Mainland as outpatient.  Coronary artery calcification:  Continue statin therapy.  Patient had a recent stress test as noted above.  Continue to monitor  Plan of care discussed with ER physician.  If patient chooses to undergo TEE cardioversion we will plan for Monday, October 28, 2019.  If she chooses not to have TEE cardioversion from a cardiovascular standpoint she may be discharged home once her ventricular rate is well controlled and on oral medications and weaned off of Cardizem drip.  And she has resolution of her orthostasis.  Recommend holding amlodipine until reevaluated as outpatient if clinically appropriate.  Patient is asked to follow-up with Dr. Rosemary Holms in 1 week post ER visit.  Patient's questions and concerns were addressed to her and her daughter's satisfaction. She and her daughter voices understanding of the instructions provided during this encounter.   This note was created using a voice recognition software as a result there may be grammatical errors inadvertently enclosed that do not reflect the nature of this encounter. Every attempt is made to correct such errors.  Tessa Lerner, DO, [facility] Office: [phone] 10/26/2019, 5:52 PM

## 2019-10-26 NOTE — ED Notes (Signed)
cardizem being weaned from the pt

## 2019-10-26 NOTE — ED Notes (Signed)
Urine sent to lab WITH culture. 

## 2019-10-26 NOTE — ED Triage Notes (Addendum)
Pt arrives to ED w/ c/o generalized weakness that started at 1000 this morning. Pt orthostatic + w/ EMS. Pt noted to be in a-fib RVR w/ HR 120-145. Pt received 1 nitro prior to EMS arrival at scene. Pt denies chest pain, endorses sob.

## 2019-10-26 NOTE — ED Provider Notes (Signed)
Care assumed from [facility], [place], at shift change, please see their notes for full documentation of patient's complaint/HPI. Briefly, pt here with complaint of generalized weakness, lightheadedness, and 2 near syncopel episodes. Pt found to be in A fib with RVR on arrival. Plan is to admit to cardiology.   Physical Exam  BP (!) 129/89   Pulse 92   Temp (!) 97.3 F (36.3 C) (Oral)   Resp 17   Ht 5\' 3"  (1.6 m)   Wt 56.2 kg   SpO2 97%   BMI 21.97 kg/m   Physical Exam Vitals and nursing note reviewed.  Constitutional:      Appearance: She is not ill-appearing.  HENT:     Head: Normocephalic and atraumatic.  Eyes:     Conjunctiva/sclera: Conjunctivae normal.  Cardiovascular:     Rate and Rhythm: Normal rate and regular rhythm.  Pulmonary:     Effort: Pulmonary effort is normal.     Breath sounds: Normal breath sounds.  Skin:    General: Skin is warm and dry.     Coloration: Skin is not jaundiced.  Neurological:     Mental Status: She is alert.     ED Course/Procedures     Procedures  Results for orders placed or performed during the hospital encounter of 10/26/19  SARS Coronavirus 2 by RT PCR (hospital order, performed in [facility] hospital lab) Nasopharyngeal Nasopharyngeal Swab   Specimen: Nasopharyngeal Swab  Result Value Ref Range   SARS Coronavirus 2 NEGATIVE NEGATIVE  Basic metabolic panel  Result Value Ref Range   Sodium 140 135 - 145 mmol/L   Potassium 4.6 3.5 - 5.1 mmol/L   Chloride 106 98 - 111 mmol/L   CO2 27 22 - 32 mmol/L   Glucose, Bld 108 (H) 70 - 99 mg/dL   BUN 24 (H) 8 - 23 mg/dL   Creatinine, Ser [facility] 0.44 - 1.00 mg/dL   Calcium 8.8 (L) 8.9 - 10.3 mg/dL   GFR calc non Af Amer >60 >60 mL/min   GFR calc Af Amer >60 >60 mL/min   Anion gap 7 5 - 15  CBC  Result Value Ref Range   WBC 8.0 4.0 - 10.5 K/uL   RBC 4.20 3.87 - 5.11 MIL/uL   Hemoglobin 13.3 12.0 - 15.0 g/dL   HCT 7.42 36 - 46 %   MCV 98.8 80.0 - 100.0 fL   MCH 31.7 26.0 - 34.0 pg    MCHC 32.0 30.0 - 36.0 g/dL   RDW 59.5 63.8 - 75.6 %   Platelets 299 150 - 400 K/uL   nRBC 0.0 0.0 - 0.2 %  Magnesium  Result Value Ref Range   Magnesium 2.3 1.7 - 2.4 mg/dL  Hepatic function panel  Result Value Ref Range   Total Protein 6.6 6.5 - 8.1 g/dL   Albumin 3.6 3.5 - 5.0 g/dL   AST 18 15 - 41 U/L   ALT 16 0 - 44 U/L   Alkaline Phosphatase 60 38 - 126 U/L   Total Bilirubin 0.7 0.3 - 1.2 mg/dL   Bilirubin, Direct 0.1 0.0 - 0.2 mg/dL   Indirect Bilirubin 0.6 0.3 - 0.9 mg/dL  Urinalysis, Routine w reflex microscopic  Result Value Ref Range   Color, Urine YELLOW YELLOW   APPearance CLEAR CLEAR   Specific Gravity, Urine 1.013 1.005 - 1.030   pH 7.0 5.0 - 8.0   Glucose, UA NEGATIVE NEGATIVE mg/dL   Hgb urine dipstick NEGATIVE NEGATIVE  Bilirubin Urine NEGATIVE NEGATIVE   Ketones, ur 20 (A) NEGATIVE mg/dL   Protein, ur NEGATIVE NEGATIVE mg/dL   Nitrite NEGATIVE NEGATIVE   Leukocytes,Ua NEGATIVE NEGATIVE  Troponin I (High Sensitivity)  Result Value Ref Range   Troponin I (High Sensitivity) 7 <18 ng/L  Troponin I (High Sensitivity)  Result Value Ref Range   Troponin I (High Sensitivity) 6 <18 ng/L   DG Chest 2 View  Result Date: 10/26/2019 CLINICAL DATA:  Atrial fibrillation, shortness of breath for 3 months, hypertension EXAM: CHEST - 2 VIEW COMPARISON:  08/17/2019 FINDINGS: Normal heart size, mediastinal contours, and pulmonary vascularity. Atherosclerotic calcification aorta. 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 Linear scarring in lingula unchanged. No acute infiltrate, pleural effusion or pneumothorax. Bones demineralized. IMPRESSION: COPD changes with minimal lingular scarring. No acute abnormalities. Electronically Signed   By: Ulyses Southward M.D.   On: 10/26/2019 14:00    MDM  Cardiologist Dr. Odis Hollingshead evaluated patient; recommends giving pt's regular nighttime dose of Metoprolol 12.5 mg and evaluated if her heart continues to be controlled.  If so she can be weaned off of the cardizem and if she continues to be stable she can be discharged home. Please see his note for further recs.   Pt weaned off of cardizem and provided her regular dose of metoprolol. HR continues to be in the 70's. Orthostatics repeated per cardiology recommendations; within normal limits. Pt to be discharged home at this time. Will have her hold her amlodipine until she can be seen by cardiology next week. Strict return precautions discussed with pt and daughter. They are in agreement with plan and pt stable for discharge home.   This note was prepared using Dragon voice recognition software and may include unintentional dictation errors due to the inherent limitations of voice recognition software.       Tanda Rockers, PA-C 10/26/19 2217    Jacalyn Lefevre, MD 10/26/19 [phone]

## 2019-10-26 NOTE — Discharge Instructions (Signed)
Continue taking your Metoprolol 12.5 mg twice daily and your Eliquis as prescribed. It is recommended that you hold off on the Amlodipine until you are evaluated by Dr. Rosemary Holms in the outpatient setting. Please call his office Monday morning to schedule a follow up appointment regarding your ED visit.   Return to the ED IMMEDIATELY for any worsening symptoms including heart palpitations, chest pain, shortness of breath, dizziness, if you pass out, any weakness to your extremities, or any other new/concerning symptoms.

## 2019-10-26 NOTE — ED Provider Notes (Addendum)
[facility] EMERGENCY DEPARTMENT Provider Note   CSN: [id number] Arrival date & time: 10/26/19  1240     History Chief Complaint  Patient presents with  . Atrial Fibrillation    Jill Jackson is a 79 y.o. female.  Jill Jackson is a 79 y.o. female with a history of A. fib, hypertension, coronary artery disease, hyperlipidemia, mitral and tricuspid valve regurg, bradycardia, who presents to the emergency department via EMS for evaluation of generalized weakness that started around 10 AM this morning associated with lightheadedness and 2 near syncopal episodes.  Patient states she woke up this morning not feeling well and when she got up she felt like her heart was beating fast, she was having some chest pain and shortness of breath, so took 1 tablet of sublingual nitro prior to EMS arrival, and then started to feel increasingly weak and lightheaded, had to sit down because she felt like she was going to pass out.  When EMS arrived on scene patient was noted to be in A. fib with RVR with a heart rate of 120-145.  She had positive orthostatics with EMS.  States that she has been feeling so poorly since she got up this morning but it has been worse since she took nitro, she had a second near syncopal episode with EMS when they were performing orthostatics.  Chest pain has resolved since arriving in the emergency department.  Reports she has been dealing with weakness and fatigue for quite a while now.  She last saw her cardiologist on 7/14 and they discussed this, she was taking metoprolol 25 mg twice daily, but on recent event monitoring she was having frequent episodes of bradycardia and only had a 10% A. fib burden, so they decided to decrease her metoprolol to 12.5 twice daily after initially considering stopping the medicine altogether, but patient had an episode of tachycardia and palpitations the next day and called into the office.  She has been taking 12.5  twice daily since then but has had frequent episodes where she feels poorly, she is not sure if she has been in A. fib all those times.  She has had shortness of breath associated with her fatigue and weakness that is worse with exertion.  Had first dose of her Covid vaccine but never received the second because her daughter was worried that that contributed to her weakness and symptoms.  She has recently been dealing with some intermittent abdominal pains, poor appetite and poor intake, has seen her GI doctor for this and has had a negative CT scan.  She is followed by Dr. Rosemary Holms with [facility] cardiology.        Past Medical History:  Diagnosis Date  . Atrial fibrillation (HCC) 11/01/2018  . HLD (hyperlipidemia) 11/01/2018  . Hypertension     Patient Active Problem List   Diagnosis Date Noted  . Bradycardia 10/16/2019  . Coronary artery calcification seen on CAT scan 09/12/2019  . Angina pectoris (HCC) 07/27/2019  . Tobacco use 07/27/2019  . 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 Nonrheumatic mitral valve regurgitation 11/27/2018  . Essential hypertension 11/12/2018  . Mixed hyperlipidemia 11/01/2018  . Atrial fibrillation (HCC) 11/01/2018    Past Surgical History:  Procedure Laterality Date  . TUBAL LIGATION  1977     OB History   No obstetric history on file.     Family History  Problem Relation Age of Onset  . Stroke Mother   . Kidney failure Sister   .  Hypertension Sister   . Thyroid disease Sister   . Stroke Brother   . Hypertension Brother     Social History   Tobacco Use  . Smoking status: Light Tobacco Smoker    Packs/day: 1.00    Years: 25.00    Pack years: 25.00    Types: Cigarettes  . Smokeless tobacco: Never Used  . Tobacco comment: 1-2 cigarettes a day. Has 25-26 pack year history  Vaping Use  . Vaping Use: Never used  Substance Use Topics  . Alcohol use: Not Currently  . Drug use: Not Currently    Home  Medications Prior to Admission medications   Medication Sig Start Date End Date Taking? Authorizing Provider  acetaminophen (TYLENOL) 500 MG tablet Take 500 mg by mouth 2 (two) times daily as needed for mild pain or headache.    [provider]  amLODipine (NORVASC) 2.5 MG tablet Take 1 tablet (2.5 mg total) by mouth daily. 09/12/19 12/11/19  Patwardhan, Anabel Bene, MD  apixaban (ELIQUIS) 2.5 MG TABS tablet Take 1 tablet (2.5 mg total) by mouth 2 (two) times daily. 10/16/19   Patwardhan, Anabel Bene, MD  atorvastatin (LIPITOR) 10 MG tablet Take 1 tablet (10 mg total) by mouth daily. 09/12/19 12/11/19  Patwardhan, Anabel Bene, MD  bismuth subsalicylate (PEPTO BISMOL) 262 MG/15ML suspension Take 15 mLs by mouth every 6 (six) hours as needed for indigestion.    [provider]  diphenhydrAMINE-APAP, sleep, (TYLENOL PM EXTRA STRENGTH PO) Take 0.5 tablets by mouth at bedtime as needed (Sleep).     [provider]  escitalopram (LEXAPRO) 10 MG tablet Take 5 mg by mouth daily.  08/14/19   [provider]  HYDROcodone-acetaminophen (NORCO/VICODIN) 5-325 MG tablet Take 1 tablet by mouth every 4 (four) hours as needed for moderate pain.  08/05/19   [provider]  latanoprost (XALATAN) 0.005 % ophthalmic solution Place 1 drop into both eyes at bedtime.    [provider]  MELATONIN PO Take 1 tablet by mouth daily as needed (for sleep).    [provider]  metoprolol tartrate (LOPRESSOR) 25 MG tablet Take 12.5 mg by mouth in the morning and at bedtime. palpitations    [provider]  nitroGLYCERIN (NITROSTAT) 0.4 MG SL tablet Place 1 tablet (0.4 mg total) under the tongue every 5 (five) minutes as needed for up to 25 days for chest pain. 07/30/19 09/12/19  Yates Decamp, MD  omeprazole (PRILOSEC) 20 MG capsule Take 20 mg by mouth daily before breakfast.     [provider]  psyllium (METAMUCIL) 58.6 % powder Take 1 packet by mouth daily as needed (GI  issues).     [provider]    Allergies    Tetracyclines & related  Review of Systems   Review of Systems  Constitutional: Positive for fatigue. Negative for chills and fever.  Eyes: Negative for visual disturbance.  Respiratory: Positive for shortness of breath. Negative for cough.   Cardiovascular: Positive for chest pain and palpitations. Negative for leg swelling.  Gastrointestinal: Positive for abdominal pain. Negative for nausea and vomiting.  Genitourinary: Negative for dysuria and frequency.  Musculoskeletal: Negative for arthralgias and myalgias.  Skin: Negative for color change and wound.  Neurological: Positive for weakness (Generalized) and light-headedness. Negative for dizziness, syncope, speech difficulty, numbness and headaches.  All other systems reviewed and are negative.   Physical Exam Updated Vital Signs BP 103/69 (BP Location: Left Arm)   Pulse (!) 106   Temp (!)  97.3 F (36.3 C) (Oral)   Resp 14   Ht  (1.6 m)   Wt 56.2 kg   SpO2 96%   BMI 21.97 kg/m   Physical Exam Vitals and nursing note reviewed.  Constitutional:      General: She is not in acute distress.    Appearance: Normal appearance. She is well-developed. She is not diaphoretic.     Comments: Elderly female, chronically ill-appearing, but in no acute distress  HENT:     Head: Normocephalic and atraumatic.     Mouth/Throat:     Comments: Mucous membranes slightly dry Eyes:     General:        Right eye: No discharge.        Left eye: No discharge.     Conjunctiva/sclera: Conjunctivae normal.     Pupils: Pupils are equal, round, and reactive to light.  Cardiovascular:     Rate and Rhythm: Tachycardia present. Rhythm irregular.     Heart sounds: Murmur heard.  No friction rub. No gallop.      Comments: Tachycardia with irregularly irregular rhythm, murmur noted Pulmonary:     Effort: Pulmonary effort is normal. No respiratory distress.     Breath sounds: Normal  breath sounds. No wheezing or rales.     Comments: Respirations equal and unlabored, patient able to speak in full sentences, lungs clear to auscultation bilaterally slightly diminished breath sounds Abdominal:     General: Bowel sounds are normal. There is no distension.     Palpations: Abdomen is soft. There is no mass.     Tenderness: There is no abdominal tenderness. There is no guarding.     Comments: Abdomen soft, nondistended, nontender to palpation in all quadrants without guarding or peritoneal signs  Musculoskeletal:        General: No deformity.     Cervical back: Neck supple.     Right lower leg: No edema.     Left lower leg: No edema.  Skin:    General: Skin is warm and dry.     Capillary Refill: Capillary refill takes less than 2 seconds.  Neurological:     Mental Status: She is alert.     Coordination: Coordination normal.     Comments: Speech is clear, able to follow commands CN III-XII intact Normal strength in upper and lower extremities bilaterally including dorsiflexion and plantar flexion, strong and equal grip strength Sensation normal to light and sharp touch Moves extremities without ataxia, coordination intact  Psychiatric:        Mood and Affect: Mood normal.        Behavior: Behavior normal.     ED Results / Procedures / Treatments   Labs (all labs ordered are listed, but only abnormal results are displayed) Labs Reviewed  BASIC METABOLIC PANEL - Abnormal; Notable for the following components:      Result Value   Glucose, Bld 108 (*)    BUN 24 (*)    Calcium 8.8 (*)    All other components within normal limits  SARS CORONAVIRUS 2 BY RT PCR (HOSPITAL ORDER, PERFORMED IN [facility])  CBC  MAGNESIUM  HEPATIC FUNCTION PANEL  URINALYSIS, ROUTINE W REFLEX MICROSCOPIC  TROPONIN I (HIGH SENSITIVITY)  TROPONIN I (HIGH SENSITIVITY)    EKG EKG Interpretation  Date/Time:  Saturday October 26 2019 12:49:16 EDT Ventricular Rate:  106 PR  Interval:    QRS Duration: 70 QT Interval:  338 QTC Calculation: 448 R Axis:  7 Text Interpretation: Atrial fibrillation with rapid ventricular response Nonspecific ST abnormality Abnormal ECG A fib new from previous Confirmed by Frederick Peers [phone]) on 10/26/2019 1:40:00 PM   Radiology DG Chest 2 View  Result Date: 10/26/2019 CLINICAL DATA:  Atrial fibrillation, shortness of breath for 3 months, hypertension EXAM: CHEST - 2 VIEW COMPARISON:  08/17/2019 FINDINGS: Normal heart size, mediastinal contours, and pulmonary vascularity. Atherosclerotic calcification aorta. 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 Linear scarring in lingula unchanged. No acute infiltrate, pleural effusion or pneumothorax. Bones demineralized. IMPRESSION: COPD changes with minimal lingular scarring. No acute abnormalities. Electronically Signed   By: Ulyses Southward M.D.   On: 10/26/2019 14:00    Procedures .Critical Care Performed by: Dartha Lodge, PA-C Authorized by: Dartha Lodge, PA-C   Critical care provider statement:    Critical care time (minutes):  45   Critical care was necessary to treat or prevent imminent or life-threatening deterioration of the following conditions:  Cardiac failure (A. fib with RVR)   Critical care was time spent personally by me on the following activities:  Discussions with consultants, evaluation of patient's response to treatment, examination of patient, ordering and performing treatments and interventions, ordering and review of laboratory studies, ordering and review of radiographic studies, pulse oximetry, re-evaluation of patient's condition, obtaining history from patient or surrogate and review of old charts   (including critical care time)  Medications Ordered in ED Medications  sodium chloride flush (NS) 0.9 % injection 3 mL (has no administration in time range)  sodium chloride 0.9 % bolus 500 mL (has no administration in time range)   diltiazem (CARDIZEM) 125 mg in dextrose 5% 125 mL (1 mg/mL) infusion (has no administration in time range)  sodium chloride 0.9 % bolus 500 mL (has no administration in time range)    ED Course  I have reviewed the triage vital signs and the nursing notes.  Pertinent labs & imaging results that were available during my care of the patient were reviewed by me and considered in my medical decision making (see chart for details).    MDM Rules/Calculators/A&P                          79 year old female arrives with fatigue, lightheadedness and near syncope, found to be in A. fib with RVR with EMS with heart rates ranging in the 120s-140s.  On arrival she has some soft blood pressures, does report that when she woke up feeling poorly this morning with some chest pain or shortness of breath she did take a dose of nitro shortly before calling EMS.  She has not had any associated fevers or cough.  Chest pain or shortness of breath have since resolved.  She has been dealing with some intermittent abdominal pains that she has been seeing GI for but has had a unremarkable work-up so far.  Reports poor p.o. intake and appetite recently that may be contributing to her weakness and fatigue.  Had the first dose of her Covid vaccine but never received the second.  Pt with heart rates in the 110-120s on arrival, BP of 103/69, no hypoxia and patient is not in any distress.  Lab work so far has been reassuring, no leukocytosis, normal hemoglobin, glucose of 108 but no other significant electrolyte derangements, normal renal function and liver function.  Initial troponins are negative.  Normal mag.  Chest x-ray with changes consistent with COPD but no other acute  abnormalities, no evidence of infiltrate or pulmonary edema.  Case discussed with Dr. Odis Hollingsheadolia with [facility] cardiology who reviewed patient's chart, and recommends giving IV fluid bolus and then starting patient on Cardizem drip, does not want any bolus given  to the Cardizem drip given patient's soft blood pressures.  Recommends getting delta troponin and monitoring closely.  Would avoid cardioversion unless patient becomes hemodynamically unstable.  He will see and admit the patient to cardiology service.  I discussed the plan with the patient and her daughter who is now at bedside and they expressed understanding and agreement.  Final Clinical Impression(s) / ED Diagnoses Final diagnoses:  Atrial fibrillation with RVR [facility](HCC)  Near syncope    Rx / DC Orders ED Discharge Orders    None       Dartha LodgeFord, Olean Sangster N, [place] 10/26/19 1527    Dartha LodgeFord, Cortez Steelman N, PA-C 10/26/19 1527    Little, Ambrose Finlandachel Morgan, MD 10/28/19 [phone]

## 2019-11-05 ENCOUNTER — Telehealth: Payer: Self-pay

## 2019-11-05 ENCOUNTER — Ambulatory Visit: Payer: Medicare PPO | Admitting: Cardiology

## 2019-11-05 ENCOUNTER — Telehealth: Payer: Self-pay | Admitting: Cardiology

## 2019-11-05 VITALS — BP 127/85 | HR 57 | Ht 63.0 in | Wt 126.4 lb

## 2019-11-05 DIAGNOSIS — I480 Paroxysmal atrial fibrillation: Secondary | ICD-10-CM

## 2019-11-05 DIAGNOSIS — E782 Mixed hyperlipidemia: Secondary | ICD-10-CM

## 2019-11-05 DIAGNOSIS — R001 Bradycardia, unspecified: Secondary | ICD-10-CM

## 2019-11-05 DIAGNOSIS — I25118 Atherosclerotic heart disease of native coronary artery with other forms of angina pectoris: Secondary | ICD-10-CM

## 2019-11-05 DIAGNOSIS — I10 Essential (primary) hypertension: Secondary | ICD-10-CM

## 2019-11-05 NOTE — Telephone Encounter (Signed)
Patient stated she feels better, only feels a rapid heart beat.   Her BP is 117/63 Pulse 70    -[place]

## 2019-11-05 NOTE — Telephone Encounter (Signed)
Spoke with the patient. She is feeling better at the moment. Gave her the option of seeing me today or keep appt on Friday. Patient will decide and lt the office know.  Thanks MJP

## 2019-11-05 NOTE — Telephone Encounter (Signed)
Happy to do cardioversion, but elective cardioversion still requires COVID testing. I do not think elective cardioversion can be performed today. If patient is symptomatic, I could either see her in office (in between my cases) this morning or she may have to visit ER.   Thanks MJP

## 2019-11-05 NOTE — Telephone Encounter (Signed)
Fu remains for this Friday 8/6 with mp.

## 2019-11-05 NOTE — Telephone Encounter (Signed)
Received a call at 5 AM this morning, stating that patient is short of breath, does not feel well and is fatigued and has been in persistent atrial fibrillation.  She is wondering if she can be scheduled for cardioversion.   Yates Decamp, MD, [facility] 11/05/2019, 6:36 AM Office: [phone]

## 2019-11-05 NOTE — Telephone Encounter (Signed)
Noted  

## 2019-11-06 ENCOUNTER — Other Ambulatory Visit: Payer: Self-pay | Admitting: Cardiology

## 2019-11-06 ENCOUNTER — Encounter: Payer: Self-pay | Admitting: Cardiology

## 2019-11-06 ENCOUNTER — Other Ambulatory Visit: Payer: Self-pay

## 2019-11-06 DIAGNOSIS — I2510 Atherosclerotic heart disease of native coronary artery without angina pectoris: Secondary | ICD-10-CM | POA: Insufficient documentation

## 2019-11-06 MED ORDER — AMIODARONE HCL 200 MG PO TABS: 200.0000 mg | ORAL_TABLET | Freq: Two times a day (BID) | ORAL | 0 refills | Status: DC

## 2019-11-06 NOTE — Progress Notes (Signed)
Patient referred by [name], * for atrial fibrilaltion  Subjective:   Jill Jackson, female    DOB: 10/17/1940, 79 y.o.   MRN: [id number]   Chief Complaint  Patient presents with  . Atrial Fibrillation    HPI  79 y.o. Caucasian female with hypertension, former smoker, paroxysmal atrial fibrillation, mild to moderate MR, mild TR, exertional chest pain, dyspnea, generalized fatigue.  Patient was recently seen in [facility] ED with Afib with RVR. This occurred after stopping metoprolol tartrate for marked bradycardia. Rate was controlled with diltiazem and she was discharged home from ED. Patient developed palpitations and mild shortness of breath in morning of 8/3. Acute visit appt was thus made.  Patient's heart rate has since slowed down, and she is feeling better. Unfortunately, EKG could not be performed in the office today due to Epic/internet issues.   Current Outpatient Medications on File Prior to Visit  Medication Sig Dispense Refill  . acetaminophen (TYLENOL) 500 MG tablet Take 500 mg by mouth 2 (two) times daily as needed for mild pain or headache. (Patient not taking: Reported on 10/26/2019)    . amLODipine (NORVASC) 2.5 MG tablet Take 1 tablet (2.5 mg total) by mouth daily. 30 tablet 2  . apixaban (ELIQUIS) 2.5 MG TABS tablet Take 1 tablet (2.5 mg total) by mouth 2 (two) times daily. 180 tablet 2  . atorvastatin (LIPITOR) 10 MG tablet Take 1 tablet (10 mg total) by mouth daily. 30 tablet 2  . bismuth subsalicylate (PEPTO BISMOL) 262 MG/15ML suspension Take 15 mLs by mouth every 6 (six) hours as needed (for upset stomach).     . busPIRone (BUSPAR) 5 MG tablet Take 5 mg by mouth 3 (three) times daily as needed (for anxiety).    Marland Kitchen diphenhydrAMINE-APAP, sleep, (TYLENOL PM EXTRA STRENGTH PO) Take 0.5 tablets by mouth at bedtime as needed (Sleep).     Marland Kitchen escitalopram (LEXAPRO) 10 MG tablet Take 5 mg by mouth daily.  (Patient not taking: Reported on  10/26/2019)    . HYDROcodone-acetaminophen (NORCO/VICODIN) 5-325 MG tablet Take 1 tablet by mouth every 4 (four) hours as needed for moderate pain.     Marland Kitchen latanoprost (XALATAN) 0.005 % ophthalmic solution Place 1 drop into both eyes at bedtime.    . melatonin 1 MG TABS tablet Take 3-5 mg by mouth at bedtime as needed (for sleep).    . nitroGLYCERIN (NITROSTAT) 0.4 MG SL tablet Place 1 tablet (0.4 mg total) under the tongue every 5 (five) minutes as needed for up to 25 days for chest pain. 25 tablet 3  . pantoprazole (PROTONIX) 40 MG tablet Take 40 mg by mouth daily before breakfast.     . psyllium (METAMUCIL) 58.6 % powder Take 1 packet by mouth daily as needed (GI issues- MIX AND DRINK).      No current facility-administered medications on file prior to visit.    Cardiovascular studies:  EKG 10/16/2019: Marked sinus bradycardia 44 bppm  Event monitor 09/17/2019 - 09/30/2019: Diagnostic time: 99%  Dominant rhythm: Sinus. HR 44-136 bpm. Avg HR 63 bpm. Episodes of sustained Afib with RVR up to 136 bpm Afib burden 10% No atrial flutter/SVT/VT/high grade AV block, sinus pause >3sec noted. Manually triggered episodes were accidental push, correlated with Afib.   EKG 09/12/2019: Afib 94 bpm Poor R wave progression  Lexiscan/modified Bruce Tetrofosmin stress test 08/26/2019: Lexiscan/modified Bruce nuclear stress test performed using 1-day protocol. Stress EKG is non-diagnostic, as this is pharmacological stress test.  In addition, stress EKG at 79% MPHR showed sinus tachycardia, <1 mm ST depression in inferolateral leads.  Normal myocardial perfusion. Stress LVEF 62%. Low risk study.   Echocardiogram 08/13/2019:  Normal LV systolic function with visual EF 55-60%. Left ventricle cavity  is normal in size. Mild left ventricular hypertrophy. Normal global wall  motion. Normal diastolic filling pattern, normal LAP.  Mild calcification of the mitral valve annulus. Mild mitral valve leaflet    thickening. Mildly restricted mitral valve leaflets. Moderate (Grade II)  mitral regurgitation.  Mild tricuspid regurgitation.  Mild pulmonary hypertension. RVSP measures 37 mmHg.  Mild pulmonic regurgitation.  IVC is dilated with respiratory variation.  Compared to prior study 11/23/2018 MR is now moderate and pulmonary  hypertension is new.   Carotid artery duplex 08/13/2019:  Minimal stenosis in the right internal carotid artery (1-15%).  Stenosis in the left internal carotid artery (16-49%).  Antegrade right vertebral artery flow. Antegrade left vertebral artery  flow.  Follow up in one year is appropriate if clinically indicated.  CTA Chest 07/27/2019: 1. No evidence of acute abnormality. No evidence of pulmonary emboli. 2. Coronary artery disease. LAD and RCA coronary acalcifications 3. Aortic Atherosclerosis (ICD10-I70.0) and Emphysema (ICD10-J43.9).   EKG 06/17/2019: Atrial rhythm 57 bpm. Nonspecific ST abnormalities.  Echocardiogram 11/23/2018:  Left ventricle cavity is normal in size. Normal left ventricular wall thickness. Normal LV systolic function with EF 61%. Normal global wall motion. Doppler evidence of grade I (impaired) diastolic dysfunction, normal LAP.  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 Mild tricuspid regurgitation. Estimated pulmonary artery systolic pressure is 28 mmHg. Estimated RA pressure 8 mmHg.  EKG 11/12/2018: Sinus rhythm 81 bpm.   Left atrial enlargement.  Nonspecific ST depression.  EKG 10/18/2018: Atrial fibrillation with RVR 122 bpm. Low voltage.   Recent labs: 11/23/2018: Glucose 93. BUN/Cr 15/0.78. eGFT 73. Na/K 140/4.8.  Chol 154, TG 106, HDL 70, LDL 63.  10/18/2018: Glucose 80. BUN/Cr 16/0.72. eGFR 80/92. Na/K 142/4.3. Rest of the CMP normal. H/H 13/40. MCV 96. TSH 5.5 High   Review of Systems  Cardiovascular: Positive for dyspnea on exertion. Negative for chest pain, leg swelling,  palpitations and syncope.  Respiratory: Positive for shortness of breath.   Musculoskeletal: Positive for back pain.        Vitals:   11/06/19 2227  BP: 127/85  Pulse: (!) 57  SpO2: 96%     Body mass index is 22.39 kg/m. Filed Weights   11/06/19 2227  Weight: 126 lb 6.4 oz (57.3 kg)     Objective:   Physical Exam Vitals and nursing note reviewed.  Constitutional:      Appearance: She is well-developed.  Neck:     Vascular: No JVD.  Cardiovascular:     Rate and Rhythm: Regular rhythm. Bradycardia present.     Pulses: Intact distal pulses.     Heart sounds: Normal heart sounds. No murmur heard.   Pulmonary:     Effort: Pulmonary effort is normal.     Breath sounds: Normal breath sounds. No wheezing or rales.        Assessment & Recommendations:   79 y.o. Caucasian female with hypertension, former smoker, paroxysmal atrial fibrillation, mild to moderate MR, mild TR, exertional chest pain, dyspnea, generalized fatigue  Generalized fatigue: Still suspect that this could be related to bradycardia from metoprolol. See below.   PAF: Episodes of Afib with RVR, along with otherwise marked bradycardia while on metoprolo. .Reduce metoprolol tartarate to 12.5 mg  once daily. Added amiodarone 200 mg bid for 2 weeks, then reduce to 200 mg daily. If she develops persistent Afib in spite of amiodarone, could then consider cardioversion while on amiodarone. She is reportedly mot taking lexapro, so QTc prolongation is not a concern.  CHA2DS2VAsc score 4, annual stroke risk 5%. Reduce eliquis to 2.5 mg bid, now that she is >80 yrs. Avoid NSAIDS.  CAD: While she does have LAD and RCA calcification and very likely has multivessel coronary artery disease, her stress test does not show any ischemia.   Hypertension: Controlled  F/u in 2 weeks   Constance Whittle Esther Hardy, MD [facility] Pager: [phone] Office: [phone] If no answer Cell [phone]

## 2019-11-08 ENCOUNTER — Ambulatory Visit: Payer: Medicare PPO | Admitting: Cardiology

## 2019-11-08 ENCOUNTER — Telehealth: Payer: Self-pay

## 2019-11-08 NOTE — Telephone Encounter (Signed)
Spoke with the patient re:her questions about amiodarone.

## 2019-11-08 NOTE — Telephone Encounter (Signed)
Done

## 2019-11-08 NOTE — Telephone Encounter (Signed)
Pt called asking if you could please give her a call back. Did not mention why.

## 2019-11-20 ENCOUNTER — Ambulatory Visit: Payer: Medicare PPO | Admitting: Cardiology

## 2019-11-21 ENCOUNTER — Other Ambulatory Visit: Payer: Self-pay | Admitting: Cardiology

## 2019-11-21 DIAGNOSIS — I480 Paroxysmal atrial fibrillation: Secondary | ICD-10-CM

## 2019-11-21 NOTE — Telephone Encounter (Signed)
Changing dose to 200 mg once daily.

## 2019-11-21 NOTE — Telephone Encounter (Signed)
Refill

## 2019-11-21 NOTE — Telephone Encounter (Signed)
If patient has enough till 8/25, I will send new prescription then. If not, let me know, will send it now.

## 2019-11-23 ENCOUNTER — Other Ambulatory Visit: Payer: Self-pay | Admitting: Cardiology

## 2019-11-23 ENCOUNTER — Telehealth: Payer: Self-pay | Admitting: Cardiology

## 2019-11-23 DIAGNOSIS — I480 Paroxysmal atrial fibrillation: Secondary | ICD-10-CM

## 2019-11-23 MED ORDER — AMIODARONE HCL 200 MG PO TABS: 200.0000 mg | ORAL_TABLET | Freq: Two times a day (BID) | ORAL | 0 refills | Status: DC

## 2019-11-23 NOTE — Telephone Encounter (Signed)
Called on-call service for refill of amiodarone.   Med refilled for 15day and patient has an appointment with Dr. Rosemary Holms coming up to discuss this further.   Tessa Lerner, [place], [facility]  Pager: [phone] Office: [phone]

## 2019-11-27 ENCOUNTER — Encounter: Payer: Self-pay | Admitting: Cardiology

## 2019-11-27 ENCOUNTER — Ambulatory Visit: Payer: Medicare PPO | Admitting: Cardiology

## 2019-11-27 ENCOUNTER — Other Ambulatory Visit: Payer: Self-pay

## 2019-11-27 VITALS — BP 134/62 | HR 57 | Resp 16 | Ht 63.0 in | Wt 129.0 lb

## 2019-11-27 DIAGNOSIS — I25118 Atherosclerotic heart disease of native coronary artery with other forms of angina pectoris: Secondary | ICD-10-CM

## 2019-11-27 DIAGNOSIS — I480 Paroxysmal atrial fibrillation: Secondary | ICD-10-CM

## 2019-11-27 DIAGNOSIS — E782 Mixed hyperlipidemia: Secondary | ICD-10-CM

## 2019-11-27 MED ORDER — AMIODARONE HCL 200 MG PO TABS: 200.0000 mg | ORAL_TABLET | Freq: Every day | ORAL | 3 refills | Status: DC

## 2019-11-27 MED ORDER — LISINOPRIL 5 MG PO TABS: 5.0000 mg | ORAL_TABLET | Freq: Every day | ORAL | 3 refills | Status: DC

## 2019-11-27 NOTE — Progress Notes (Signed)
Patient referred by Jill Jackson, * for atrial fibrilaltion  Subjective:   Jill Jackson, female    DOB: 25-Nov-1940, 79 y.o.   MRN: [id number]   Chief Complaint  Patient presents with  . Atrial Fibrillation  . Follow-up    1-2 week    HPI  79 y.o. Caucasian female with hypertension, former smoker, paroxysmal atrial fibrillation, mild to moderate MR, mild TR, exertional chest pain, dyspnea, generalized fatigue.  She has not had any palpitations or shortness of breath symptoms. Still feels fatigued.   Current Outpatient Medications on File Prior to Visit  Medication Sig Dispense Refill  . acetaminophen (TYLENOL) 500 MG tablet Take 500 mg by mouth 2 (two) times daily as needed for mild pain or headache.     Marland Kitchen amiodarone (PACERONE) 200 MG tablet Take 1 tablet (200 mg total) by mouth 2 (two) times daily for 15 days. 30 tablet 0  . amLODipine (NORVASC) 2.5 MG tablet Take 1 tablet (2.5 mg total) by mouth daily. 30 tablet 2  . apixaban (ELIQUIS) 2.5 MG TABS tablet Take 1 tablet (2.5 mg total) by mouth 2 (two) times daily. 180 tablet 2  . atorvastatin (LIPITOR) 10 MG tablet Take 1 tablet (10 mg total) by mouth daily. 30 tablet 2  . bismuth subsalicylate (PEPTO BISMOL) 262 MG/15ML suspension Take 15 mLs by mouth every 6 (six) hours as needed (for upset stomach).     . busPIRone (BUSPAR) 5 MG tablet Take 5 mg by mouth 3 (three) times daily as needed (for anxiety).    Marland Kitchen diphenhydrAMINE-APAP, sleep, (TYLENOL PM EXTRA STRENGTH PO) Take 0.5 tablets by mouth at bedtime as needed (Sleep).     Marland Kitchen escitalopram (LEXAPRO) 10 MG tablet Take 5 mg by mouth daily.  (Patient not taking: Reported on 10/26/2019)    . HYDROcodone-acetaminophen (NORCO/VICODIN) 5-325 MG tablet Take 1 tablet by mouth every 4 (four) hours as needed for moderate pain.     Marland Kitchen latanoprost (XALATAN) 0.005 % ophthalmic solution Place 1 drop into both eyes at bedtime.    . melatonin 1 MG TABS tablet Take 3-5 mg by  mouth at bedtime as needed (for sleep).    . metoprolol tartrate (LOPRESSOR) 25 MG tablet TAKE 1 TABLET(25 MG) BY MOUTH TWICE DAILY 180 tablet 0  . nitroGLYCERIN (NITROSTAT) 0.4 MG SL tablet Place 1 tablet (0.4 mg total) under the tongue every 5 (five) minutes as needed for up to 25 days for chest pain. 25 tablet 3  . pantoprazole (PROTONIX) 40 MG tablet Take 40 mg by mouth daily before breakfast.     . psyllium (METAMUCIL) 58.6 % powder Take 1 packet by mouth daily as needed (GI issues- MIX AND DRINK).      No current facility-administered medications on file prior to visit.    Cardiovascular studies:  EKG 11/27/2019: Sinus rhythm 53 bpm Left atrial enlargement Poor R-wave progression  EKG 10/16/2019: Marked sinus bradycardia 44 bppm  Event monitor 09/17/2019 - 09/30/2019: Diagnostic time: 99%  Dominant rhythm: Sinus. HR 44-136 bpm. Avg HR 63 bpm. Episodes of sustained Afib with RVR up to 136 bpm Afib burden 10% No atrial flutter/SVT/VT/high grade AV block, sinus pause >3sec noted. Manually triggered episodes were accidental push, correlated with Afib.   EKG 09/12/2019: Afib 94 bpm Poor R wave progression  Lexiscan/modified Bruce Tetrofosmin stress test 08/26/2019: Lexiscan/modified Bruce nuclear stress test performed using 1-day protocol. Stress EKG is non-diagnostic, as this is pharmacological stress test. In addition, stress EKG  at 79% MPHR showed sinus tachycardia, <1 mm ST depression in inferolateral leads.  Normal myocardial perfusion. Stress LVEF 62%. Low risk study.   Echocardiogram 08/13/2019:  Normal LV systolic function with visual EF 55-60%. Left ventricle cavity  is normal in size. Mild left ventricular hypertrophy. Normal global wall  motion. Normal diastolic filling pattern, normal LAP.  Mild calcification of the mitral valve annulus. Mild mitral valve leaflet  thickening. Mildly restricted mitral valve leaflets. Moderate (Grade II)  mitral regurgitation.  Mild  tricuspid regurgitation.  Mild pulmonary hypertension. RVSP measures 37 mmHg.  Mild pulmonic regurgitation.  IVC is dilated with respiratory variation.  Compared to prior study 11/23/2018 MR is now moderate and pulmonary  hypertension is new.   Carotid artery duplex 08/13/2019:  Minimal stenosis in the right internal carotid artery (1-15%).  Stenosis in the left internal carotid artery (16-49%).  Antegrade right vertebral artery flow. Antegrade left vertebral artery  flow.  Follow up in one year is appropriate if clinically indicated.  CTA Chest 07/27/2019: 1. No evidence of acute abnormality. No evidence of pulmonary emboli. 2. Coronary artery disease. LAD and RCA coronary acalcifications 3. Aortic Atherosclerosis (ICD10-I70.0) and Emphysema (ICD10-J43.9).   EKG 06/17/2019: Atrial rhythm 57 bpm. Nonspecific ST abnormalities.  Echocardiogram 11/23/2018:  Left ventricle cavity is normal in size. Normal left ventricular wall thickness. Normal LV systolic function with EF 61%. Normal global wall motion. Doppler evidence of grade I (impaired) diastolic dysfunction, normal LAP.  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 Mild tricuspid regurgitation. Estimated pulmonary artery systolic pressure is 28 mmHg. Estimated RA pressure 8 mmHg.  EKG 11/12/2018: Sinus rhythm 81 bpm.   Left atrial enlargement.  Nonspecific ST depression.  EKG 10/18/2018: Atrial fibrillation with RVR 122 bpm. Low voltage.   Recent labs: 11/23/2018: Glucose 93. BUN/Cr 15/0.78. eGFT 73. Na/K 140/4.8.  Chol 154, TG 106, HDL 70, LDL 63.  10/18/2018: Glucose 80. BUN/Cr 16/0.72. eGFR 80/92. Na/K 142/4.3. Rest of the CMP normal. H/H 13/40. MCV 96. TSH 5.5 High   Review of Systems  Cardiovascular: Negative for chest pain, dyspnea on exertion, leg swelling, palpitations and syncope.  Respiratory: Negative for shortness of breath.   Musculoskeletal: Positive for back pain.         Vitals:   11/27/19 1432  BP: (!) 130/44  Pulse: (!) 56  Resp: 16  SpO2: 96%     Body mass index is 22.85 kg/m. Filed Weights   11/27/19 1432  Weight: 129 lb (58.5 kg)     Objective:   Physical Exam Vitals and nursing note reviewed.  Constitutional:      Appearance: She is well-developed.  Neck:     Vascular: No JVD.  Cardiovascular:     Rate and Rhythm: Normal rate and regular rhythm.     Pulses: Intact distal pulses.     Heart sounds: Normal heart sounds. No murmur heard.   Pulmonary:     Effort: Pulmonary effort is normal.     Breath sounds: Normal breath sounds. No wheezing or rales.        Assessment & Recommendations:   79 y.o. Caucasian female with hypertension, former smoker, paroxysmal atrial fibrillation, mild to moderate MR, mild TR, exertional chest pain, dyspnea, generalized fatigue  Generalized fatigue: Still suspect that this could be related to bradycardia from metoprolol. See below.   PAF: Episodes of Afib with RVR, along with otherwise marked bradycardia while on metoprolol. Stop metoprolol. Reduce amiodarone to 200 mg daily.  CHA2DS2VAsc  score 4, annual stroke risk 5%. Continue eliquis to 2.5 mg bid, now that she is >79 yrs. Avoid NSAIDS.  CAD: While she does have LAD and RCA calcification and very likely has multivessel coronary artery disease, her stress test does not show any ischemia.   Hypertension: Controlled  She will check CMP and TSH with PCP.  F/u in 4 weeks   [place], MD [facility] Pager: [phone] Office: [phone] If no answer Cell [phone]

## 2019-12-05 ENCOUNTER — Telehealth: Payer: Self-pay

## 2019-12-05 ENCOUNTER — Encounter: Payer: Self-pay | Admitting: Pulmonary Disease

## 2019-12-05 ENCOUNTER — Ambulatory Visit: Payer: Medicare PPO | Admitting: Pulmonary Disease

## 2019-12-05 ENCOUNTER — Other Ambulatory Visit: Payer: Self-pay

## 2019-12-05 VITALS — BP 128/72 | HR 98 | Temp 98.2°F | Ht 63.0 in | Wt 131.0 lb

## 2019-12-05 DIAGNOSIS — I4891 Unspecified atrial fibrillation: Secondary | ICD-10-CM

## 2019-12-05 DIAGNOSIS — F17210 Nicotine dependence, cigarettes, uncomplicated: Secondary | ICD-10-CM | POA: Diagnosis not present

## 2019-12-05 DIAGNOSIS — J432 Centrilobular emphysema: Secondary | ICD-10-CM

## 2019-12-05 DIAGNOSIS — R002 Palpitations: Secondary | ICD-10-CM | POA: Diagnosis not present

## 2019-12-05 DIAGNOSIS — R0602 Shortness of breath: Secondary | ICD-10-CM

## 2019-12-05 MED ORDER — TIOTROPIUM BROMIDE-OLODATEROL 2.5-2.5 MCG/ACT IN AERS: 2.0000 | INHALATION_SPRAY | Freq: Two times a day (BID) | RESPIRATORY_TRACT | 6 refills | Status: DC

## 2019-12-05 MED ORDER — BUPROPION HCL ER (SR) 150 MG PO TB12
ORAL_TABLET | ORAL | 3 refills | Status: DC
Start: 2019-12-05 — End: 2021-06-30

## 2019-12-05 NOTE — Patient Instructions (Addendum)
Centrilobular emphysema --START Stioloto TWO puffs ONCE a day --We will arrange for pulmonary function tests prior to your next visit  Palpitations --EKG in-office --F/u with Cardiology tomorrow  Tobacco abuse We discussed triggers and stressors and ways to deal with them. We discussed barriers to continued smoking and benefits of smoking cessation. Provided patient with information cessation techniques and interventions including [place] quitline.  Depression --Order Wellbutrin for depression and tobacco disorder. Please discuss with her cardiologist prior to starting as this can cause tachycardia   Follow-up in 3 months with me      Smoking Tobacco Information, Adult Smoking tobacco can be harmful to your health. Tobacco contains a poisonous (toxic), colorless chemical called nicotine. Nicotine is addictive. It changes the brain and can make it hard to stop smoking. Tobacco also has other toxic chemicals that can hurt your body and raise your risk of many cancers. How can smoking tobacco affect me? Smoking tobacco puts you at risk for:  Cancer. Smoking is most commonly associated with lung cancer, but can also lead to cancer in other parts of the body.  Chronic obstructive pulmonary disease (COPD). This is a long-term lung condition that makes it hard to breathe. It also gets worse over time.  High blood pressure (hypertension), heart disease, stroke, or heart attack.  Lung infections, such as pneumonia.  Cataracts. This is when the lenses in the eyes become clouded.  Digestive problems. This may include peptic ulcers, heartburn, and gastroesophageal reflux disease (GERD).  Oral health problems, such as gum disease and tooth loss.  Loss of taste and smell. Smoking can affect your appearance by causing:  Wrinkles.  Yellow or stained teeth, fingers, and fingernails. Smoking tobacco can also affect your social life, because:  It may be challenging to find places to smoke  when away from home. Many workplaces, [employer], hotels, and public places are tobacco-free.  Smoking is expensive. This is due to the cost of tobacco and the long-term costs of treating health problems from smoking.  Secondhand smoke may affect those around you. Secondhand smoke can cause lung cancer, breathing problems, and heart disease. Children of smokers have a higher risk for: ? Sudden infant death syndrome (SIDS). ? Ear infections. ? Lung infections. If you currently smoke tobacco, quitting now can help you:  Lead a longer and healthier life.  Look, smell, breathe, and feel better over time.  Save money.  Protect others from the harms of secondhand smoke. What actions can I take to prevent health problems? Quit smoking   Do not start smoking. Quit if you already do.  Make a plan to quit smoking and commit to it. Look for programs to help you and ask your health care provider for recommendations and ideas.  Set a date and write down all the reasons you want to quit.  Let your friends and family know you are quitting so they can help and support you. Consider finding friends who also want to quit. It can be easier to quit with someone else, so that you can support each other.  Talk with your health care provider about using nicotine replacement medicines to help you quit, such as gum, lozenges, patches, sprays, or pills.  Do not replace cigarette smoking with electronic cigarettes, which are commonly called e-cigarettes. The safety of e-cigarettes is not known, and some may contain harmful chemicals.  If you try to quit but return to smoking, stay positive. It is common to slip up when you first quit, so  take it one day at a time.  Be prepared for cravings. When you feel the urge to smoke, chew gum or suck on hard candy. Lifestyle  Stay busy and take care of your body.  Drink enough fluid to keep your urine pale yellow.  Get plenty of exercise and eat a healthy diet.  This can help prevent weight gain after quitting.  Monitor your eating habits. Quitting smoking can cause you to have a larger appetite than when you smoke.  Find ways to relax. Go out with friends or family to a movie or a restaurant where people do not smoke.  Ask your health care provider about having regular tests (screenings) to check for cancer. This may include blood tests, imaging tests, and other tests.  Find ways to manage your stress, such as meditation, yoga, or exercise. Where to find support To get support to quit smoking, consider:  Asking your health care provider for more information and resources.  Taking classes to learn more about quitting smoking.  Looking for local organizations that offer resources about quitting smoking.  Joining a support group for people who want to quit smoking in your local community.  Calling the smokefree.gov counselor helpline: 1-800-Quit-Now 757 692 9427) Where to find more information You may find more information about quitting smoking from:  HelpGuide.org: www.helpguide.org  [employer]: smokefree.gov  American Lung Association: www.lung.org Contact a health care provider if you:  Have problems breathing.  Notice that your lips, nose, or fingers turn blue.  Have chest pain.  Are coughing up blood.  Feel faint or you pass out.  Have other health changes that cause you to worry. Summary  Smoking tobacco can negatively affect your health, the health of those around you, your finances, and your social life.  Do not start smoking. Quit if you already do. If you need help quitting, ask your health care provider.  Think about joining a support group for people who want to quit smoking in your local community. There are many effective programs that will help you to quit this behavior. This information is not intended to replace advice given to you by your health care provider. Make sure you discuss any questions you  have with your health care provider. Document Revised: 12/14/2018 Document Reviewed: 04/05/2016 Elsevier Patient Education  2020 [employer].

## 2019-12-05 NOTE — Telephone Encounter (Signed)
Okay to take anotehr 1/2 tab metoprolol. I can see her tomorrow at 10 AM

## 2019-12-05 NOTE — Telephone Encounter (Signed)
Telephone encounter: pt said and said she is having 124 bmp hear rate with SOB. She said that she took a 1/2  metoprolol on sunday night which helped.   Reason for call: Afib/Fast heart rate  Usual provider: MP  Last office visit: 11/27/19  Next office visit: 12/25/19   Last hospitalization: 10/26/19  Current Outpatient Medications on File Prior to Visit  Medication Sig Dispense Refill  . acetaminophen (TYLENOL) 500 MG tablet Take 500 mg by mouth 2 (two) times daily as needed for mild pain or headache.     Marland Kitchen amiodarone (PACERONE) 200 MG tablet Take 1 tablet (200 mg total) by mouth daily for 15 days. 30 tablet 3  . amLODipine (NORVASC) 2.5 MG tablet Take 1 tablet (2.5 mg total) by mouth daily. 30 tablet 2  . apixaban (ELIQUIS) 2.5 MG TABS tablet Take 1 tablet (2.5 mg total) by mouth 2 (two) times daily. 180 tablet 2  . atorvastatin (LIPITOR) 10 MG tablet Take 1 tablet (10 mg total) by mouth daily. 30 tablet 2  . bismuth subsalicylate (PEPTO BISMOL) 262 MG/15ML suspension Take 15 mLs by mouth every 6 (six) hours as needed (for upset stomach).     . diphenhydrAMINE-APAP, sleep, (TYLENOL PM EXTRA STRENGTH PO) Take 0.5 tablets by mouth at bedtime as needed (Sleep).     Marland Kitchen HYDROcodone-acetaminophen (NORCO/VICODIN) 5-325 MG tablet Take 1 tablet by mouth every 4 (four) hours as needed for moderate pain.     Marland Kitchen latanoprost (XALATAN) 0.005 % ophthalmic solution Place 1 drop into both eyes at bedtime.    Marland Kitchen lisinopril (ZESTRIL) 5 MG tablet Take 1 tablet (5 mg total) by mouth daily. 30 tablet 3  . melatonin 1 MG TABS tablet Take 3-5 mg by mouth at bedtime as needed (for sleep).    . nitroGLYCERIN (NITROSTAT) 0.4 MG SL tablet Place 1 tablet (0.4 mg total) under the tongue every 5 (five) minutes as needed for up to 25 days for chest pain. 25 tablet 3  . pantoprazole (PROTONIX) 40 MG tablet Take 40 mg by mouth daily before breakfast.     . psyllium (METAMUCIL) 58.6 % powder Take 1 packet by mouth daily as  needed (GI issues- MIX AND DRINK).      No current facility-administered medications on file prior to visit.

## 2019-12-05 NOTE — Progress Notes (Signed)
Subjective:   PATIENT ID: Jill Jackson GENDER: female DOB: 1940-06-29, MRN: [id number]   HPI  Chief Complaint  Patient presents with  . Consult    SOB related Afib     Reason for Visit: New consult for shortness of breath  [name] is a 79 year old female with atrial fibrillation who presents as consult for shortness of breath.  Reviewed ED note from 07/26/19 and 08/17/19. Presented with chest pain and shortness of breath. Work-up was negative. She was told that she needs a breathing test however no comment seen on records.  She states she contacted her PCP and referred to Pulmonary for evaluation of shortness of breath.  For the last 6-8 months she has severe dyspnea on exertion. Has occasional wheezing. Denies coughing. Before she would feel fatigued but now she has to stop and rest multiple times including when retrieving her mail. She has had to alternate showering every other day and finds herself sitting on her bed frequently to rest between activities. She feels weak and helpless at times. Her dyspnea is associated with palpitations but not every time. She reports recent sensation of her heart racing in the last few days and is planning to discuss with her cardiologist.   Social History: Active smoker  I have personally reviewed patient's past medical/family/social history, allergies, current medications.  Past Medical History:  Diagnosis Date  . Atrial fibrillation (HCC) 11/01/2018  . HLD (hyperlipidemia) 11/01/2018  . Hypertension      Family History  Problem Relation Age of Onset  . Stroke Mother   . Kidney failure Sister   . Hypertension Sister   . Thyroid disease Sister   . Stroke Brother   . Hypertension Brother      Social History   Occupational History  . Not on file  Tobacco Use  . Smoking status: Heavy Tobacco Smoker    Packs/day: 1.00    Years: 30.00    Pack years: 30.00    Types: Cigarettes  . Smokeless tobacco: Never Used   . Tobacco comment: 1-2 cigarettes a day. ARJ 12/05/19  Vaping Use  . Vaping Use: Never used  Substance and Sexual Activity  . Alcohol use: Not Currently  . Drug use: Not Currently  . Sexual activity: Not Currently    Allergies  Allergen Reactions  . Tetracyclines & Related Nausea And Vomiting     Outpatient Medications Prior to Visit  Medication Sig Dispense Refill  . acetaminophen (TYLENOL) 500 MG tablet Take 500 mg by mouth 2 (two) times daily as needed for mild pain or headache.     Marland Kitchen amiodarone (PACERONE) 200 MG tablet Take 1 tablet (200 mg total) by mouth daily for 15 days. 30 tablet 3  . apixaban (ELIQUIS) 2.5 MG TABS tablet Take 1 tablet (2.5 mg total) by mouth 2 (two) times daily. 180 tablet 2  . atorvastatin (LIPITOR) 10 MG tablet Take 1 tablet (10 mg total) by mouth daily. 30 tablet 2  . bismuth subsalicylate (PEPTO BISMOL) 262 MG/15ML suspension Take 15 mLs by mouth every 6 (six) hours as needed (for upset stomach).     . diphenhydrAMINE-APAP, sleep, (TYLENOL PM EXTRA STRENGTH PO) Take 0.5 tablets by mouth at bedtime as needed (Sleep).     Marland Kitchen HYDROcodone-acetaminophen (NORCO/VICODIN) 5-325 MG tablet Take 1 tablet by mouth every 4 (four) hours as needed for moderate pain.     Marland Kitchen latanoprost (XALATAN) 0.005 % ophthalmic solution Place 1 drop into both eyes  at bedtime.    Marland Kitchen lisinopril (ZESTRIL) 5 MG tablet Take 1 tablet (5 mg total) by mouth daily. 30 tablet 3  . melatonin 1 MG TABS tablet Take 3-5 mg by mouth at bedtime as needed (for sleep).    . pantoprazole (PROTONIX) 40 MG tablet Take 40 mg by mouth daily before breakfast.     . psyllium (METAMUCIL) 58.6 % powder Take 1 packet by mouth daily as needed (GI issues- MIX AND DRINK).     Marland Kitchen amLODipine (NORVASC) 2.5 MG tablet Take 1 tablet (2.5 mg total) by mouth daily. (Patient not taking: Reported on 12/05/2019) 30 tablet 2  . nitroGLYCERIN (NITROSTAT) 0.4 MG SL tablet Place 1 tablet (0.4 mg total) under the tongue every 5 (five)  minutes as needed for up to 25 days for chest pain. 25 tablet 3   No facility-administered medications prior to visit.    Review of Systems  Constitutional: Negative for chills, diaphoresis, fever, malaise/fatigue and weight loss.  HENT: Negative for congestion, ear pain and sore throat.   Respiratory: Positive for cough, shortness of breath and wheezing. Negative for hemoptysis and sputum production.   Cardiovascular: Positive for palpitations. Negative for chest pain and leg swelling.  Gastrointestinal: Negative for abdominal pain, heartburn and nausea.  Genitourinary: Negative for frequency.  Musculoskeletal: Negative for joint pain and myalgias.  Skin: Negative for itching and rash.  Neurological: Negative for dizziness, weakness and headaches.  Endo/Heme/Allergies: Does not bruise/bleed easily.  Psychiatric/Behavioral: Negative for depression. The patient is not nervous/anxious.      Objective:   Vitals:   12/05/19 1540  BP: 128/72  Pulse: 98  Temp: 98.2 F (36.8 C)  TempSrc: Temporal  SpO2: 97%  Weight: 131 lb (59.4 kg)  Height: 5\' 3"  (1.6 m)   SpO2: 97 % O2 Device: None (Room air)  Physical Exam: General: Elderly-appearing, no acute distress, tearful HENT: [place], AT Eyes: EOMI, no scleral icterus Respiratory: Clear to auscultation bilaterally.  No crackles, wheezing or rales Cardiovascular: Irregularly irregular, -M/R/G, no JVD GI: BS+, soft, nontender Extremities:-Edema,-tenderness Neuro: AAO x4, CNII-XII grossly intact Skin: Intact, no rashes or bruising Psych: Normal mood, normal affect  Data Reviewed:  Imaging: CTA 07/27/19 - Centrilobular emphysema. No pulmonary emboli. No infiltrate, effusion or edema  PFT: None on file  Labs: CBC    Component Value Date/Time   WBC 8.0 10/26/2019 1251   RBC 4.20 10/26/2019 1251   HGB 13.3 10/26/2019 1251   HCT 41.5 10/26/2019 1251   PLT 299 10/26/2019 1251   MCV 98.8 10/26/2019 1251   MCH 31.7 10/26/2019 1251    MCHC 32.0 10/26/2019 1251   RDW 12.7 10/26/2019 1251   BMET    Component Value Date/Time   NA 140 10/26/2019 1251   K 4.6 10/26/2019 1251   CL 106 10/26/2019 1251   CO2 27 10/26/2019 1251   GLUCOSE 108 (H) 10/26/2019 1251   BUN 24 (H) 10/26/2019 1251   CREATININE 0.83 10/26/2019 1251   CALCIUM 8.8 (L) 10/26/2019 1251   GFRNONAA >60 10/26/2019 1251   GFRAA >60 10/26/2019 1251     Assessment & Plan:    Discussion: She is understandably frustrated with her current baseline with limited activity due to her symptoms.  Centrilobular emphysema --START Stioloto TWO puffs ONCE a day --We will arrange for pulmonary function tests prior to your next visit  Palpitations --Reviewed EKG in-office - atrial fibrillation with chronic ST depression in lateral leads ADDENDUM: Reviewed EKG with her cardiologist, Dr.  Patwardhan, via telephone regarding ST depression in lateral leads. Changes appear chronic. Patient is not having active chest pain or significant shortness of breath (compared to her recent baseline). I have advised patient that if her symptoms worsen, she will need to present to the ED  Tobacco abuse Patient is an active smoker. We discussed smoking cessation for 5 minutes. We discussed triggers and stressors and ways to deal with them. We discussed barriers to continued smoking and benefits of smoking cessation. Provided patient with information cessation techniques and interventions including [place] quitline.  Depression --Order Wellbutrin for depression and tobacco disorder. Please discuss with her cardiologist prior to starting as this can cause tachycardia  Health Maintenance Immunization History  Administered Date(s) Administered  . Influenza, High Dose Seasonal PF 01/24/2018  . PFIZER SARS-COV-2 Vaccination 06/22/2019   CT Lung Screen - not a candidate due to age. Last CT in 2021, no nodules  Orders Placed This Encounter  Procedures  . EKG 12-Lead   Meds ordered this  encounter  Medications  . Tiotropium Bromide-Olodaterol 2.5-2.5 MCG/ACT AERS    Sig: Inhale 2 puffs into the lungs in the morning and at bedtime.    Dispense:  1 each    Refill:  6    Return in about 3 months (around 03/05/2020).  I have spent a total time of 70-minutes on the day of the appointment reviewing prior documentation, coordinating care and discussing medical diagnosis and plan with the patient/family. Imaging, labs and tests included in this note have been reviewed and interpreted independently by me.  Vida Nicol Mechele Collin, MD [facility] 12/05/2019 4:17 PM  Office Number [phone]

## 2019-12-06 ENCOUNTER — Ambulatory Visit: Payer: Medicare PPO | Admitting: Cardiology

## 2019-12-06 ENCOUNTER — Telehealth: Payer: Self-pay | Admitting: Pulmonary Disease

## 2019-12-06 ENCOUNTER — Encounter: Payer: Self-pay | Admitting: Cardiology

## 2019-12-06 VITALS — BP 148/70 | HR 57 | Resp 15 | Ht 63.0 in | Wt 130.0 lb

## 2019-12-06 DIAGNOSIS — R002 Palpitations: Secondary | ICD-10-CM | POA: Insufficient documentation

## 2019-12-06 DIAGNOSIS — R0602 Shortness of breath: Secondary | ICD-10-CM | POA: Insufficient documentation

## 2019-12-06 DIAGNOSIS — E782 Mixed hyperlipidemia: Secondary | ICD-10-CM

## 2019-12-06 DIAGNOSIS — I2510 Atherosclerotic heart disease of native coronary artery without angina pectoris: Secondary | ICD-10-CM

## 2019-12-06 DIAGNOSIS — I480 Paroxysmal atrial fibrillation: Secondary | ICD-10-CM

## 2019-12-06 NOTE — Progress Notes (Signed)
Patient referred by Jill Jackson, * for atrial fibrilaltion  Subjective:   Jill Jackson, female    DOB: 1941/03/07, 79 y.o.   MRN: [id number]   Chief Complaint  Patient presents with  . Follow-up    1 week  . Atrial Fibrillation    HPI  79 y.o. Caucasian female with hypertension, former smoker, paroxysmal atrial fibrillation, mild to moderate MR, mild TR, exertional chest pain, dyspnea, generalized fatigue.  Patient has noticed recent increase in palpitations, associated shortness of breath.  EKG performed in pulmonologist office on 12/05/2019 showed A. Fib. Symptoms improved with metoprolol. She denies any chest pain.    Current Outpatient Medications on File Prior to Visit  Medication Sig Dispense Refill  . acetaminophen (TYLENOL) 500 MG tablet Take 500 mg by mouth 2 (two) times daily as needed for mild pain or headache.     Marland Kitchen amiodarone (PACERONE) 200 MG tablet Take 1 tablet (200 mg total) by mouth daily for 15 days. 30 tablet 3  . amLODipine (NORVASC) 2.5 MG tablet Take 1 tablet (2.5 mg total) by mouth daily. (Patient not taking: Reported on 12/05/2019) 30 tablet 2  . apixaban (ELIQUIS) 2.5 MG TABS tablet Take 1 tablet (2.5 mg total) by mouth 2 (two) times daily. 180 tablet 2  . atorvastatin (LIPITOR) 10 MG tablet Take 1 tablet (10 mg total) by mouth daily. 30 tablet 2  . bismuth subsalicylate (PEPTO BISMOL) 262 MG/15ML suspension Take 15 mLs by mouth every 6 (six) hours as needed (for upset stomach).     Marland Kitchen buPROPion (WELLBUTRIN SR) 150 MG 12 hr tablet Initial: 150 mg once daily for 3 days; increase to 150 mg twice daily 90 tablet 3  . diphenhydrAMINE-APAP, sleep, (TYLENOL PM EXTRA STRENGTH PO) Take 0.5 tablets by mouth at bedtime as needed (Sleep).     Marland Kitchen HYDROcodone-acetaminophen (NORCO/VICODIN) 5-325 MG tablet Take 1 tablet by mouth every 4 (four) hours as needed for moderate pain.     Marland Kitchen latanoprost (XALATAN) 0.005 % ophthalmic solution Place 1 drop into  both eyes at bedtime.    Marland Kitchen lisinopril (ZESTRIL) 5 MG tablet Take 1 tablet (5 mg total) by mouth daily. 30 tablet 3  . melatonin 1 MG TABS tablet Take 3-5 mg by mouth at bedtime as needed (for sleep).    . nitroGLYCERIN (NITROSTAT) 0.4 MG SL tablet Place 1 tablet (0.4 mg total) under the tongue every 5 (five) minutes as needed for up to 25 days for chest pain. 25 tablet 3  . pantoprazole (PROTONIX) 40 MG tablet Take 40 mg by mouth daily before breakfast.     . psyllium (METAMUCIL) 58.6 % powder Take 1 packet by mouth daily as needed (GI issues- MIX AND DRINK).     . Tiotropium Bromide-Olodaterol 2.5-2.5 MCG/ACT AERS Inhale 2 puffs into the lungs in the morning and at bedtime. 1 each 6   No current facility-administered medications on file prior to visit.    Cardiovascular studies:  EKG 12/06/2019: Sinus rhythm 57 bpm Occasional PAC    Possible old anteroseptal infarct Nonspecific ST depression Compared to EKG on 12/05/19, Afib now absent. Rate slower.    Event monitor 09/17/2019 - 09/30/2019: Diagnostic time: 99%  Dominant rhythm: Sinus. HR 44-136 bpm. Avg HR 63 bpm. Episodes of sustained Afib with RVR up to 136 bpm Afib burden 10% No atrial flutter/SVT/VT/high grade AV block, sinus pause >3sec noted. Manually triggered episodes were accidental push, correlated with Afib.   EKG 09/12/2019: Afib  94 bpm Poor R wave progression  Lexiscan/modified Bruce Tetrofosmin stress test 08/26/2019: Lexiscan/modified Bruce nuclear stress test performed using 1-day protocol. Stress EKG is non-diagnostic, as this is pharmacological stress test. In addition, stress EKG at 79% MPHR showed sinus tachycardia, <1 mm ST depression in inferolateral leads.  Normal myocardial perfusion. Stress LVEF 62%. Low risk study.   Echocardiogram 08/13/2019:  Normal LV systolic function with visual EF 55-60%. Left ventricle cavity  is normal in size. Mild left ventricular hypertrophy. Normal global wall  motion. Normal  diastolic filling pattern, normal LAP.  Mild calcification of the mitral valve annulus. Mild mitral valve leaflet  thickening. Mildly restricted mitral valve leaflets. Moderate (Grade II)  mitral regurgitation.  Mild tricuspid regurgitation.  Mild pulmonary hypertension. RVSP measures 37 mmHg.  Mild pulmonic regurgitation.  IVC is dilated with respiratory variation.  Compared to prior study 11/23/2018 MR is now moderate and pulmonary  hypertension is new.   Carotid artery duplex 08/13/2019:  Minimal stenosis in the right internal carotid artery (1-15%).  Stenosis in the left internal carotid artery (16-49%).  Antegrade right vertebral artery flow. Antegrade left vertebral artery  flow.  Follow up in one year is appropriate if clinically indicated.  CTA Chest 07/27/2019: 1. No evidence of acute abnormality. No evidence of pulmonary emboli. 2. Coronary artery disease. LAD and RCA coronary acalcifications 3. Aortic Atherosclerosis (ICD10-I70.0) and Emphysema (ICD10-J43.9).   EKG 06/17/2019: Atrial rhythm 57 bpm. Nonspecific ST abnormalities.  Echocardiogram 11/23/2018:  Left ventricle cavity is normal in size. Normal left ventricular wall thickness. Normal LV systolic function with EF 61%. Normal global wall motion. Doppler evidence of grade I (impaired) diastolic dysfunction, normal LAP.  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 Mild tricuspid regurgitation. Estimated pulmonary artery systolic pressure is 28 mmHg. Estimated RA pressure 8 mmHg.  EKG 11/12/2018: Sinus rhythm 81 bpm.   Left atrial enlargement.  Nonspecific ST depression.  EKG 10/18/2018: Atrial fibrillation with RVR 122 bpm. Low voltage.   Recent labs: 11/23/2018: Glucose 93. BUN/Cr 15/0.78. eGFT 73. Na/K 140/4.8.  Chol 154, TG 106, HDL 70, LDL 63.  10/18/2018: Glucose 80. BUN/Cr 16/0.72. eGFR 80/92. Na/K 142/4.3. Rest of the CMP normal. H/H 13/40. MCV 96. TSH 5.5  High   Review of Systems  Cardiovascular: Negative for chest pain, dyspnea on exertion, leg swelling, palpitations and syncope.  Respiratory: Negative for shortness of breath.   Musculoskeletal: Positive for back pain.        Vitals:   12/06/19 1016  BP: (!) 148/70  Pulse: (!) 57  Resp: 15  SpO2: 97%     Body mass index is 23.03 kg/m. Filed Weights   12/06/19 1016  Weight: 130 lb (59 kg)     Objective:   Physical Exam Vitals and nursing note reviewed.  Constitutional:      Appearance: She is well-developed.  Neck:     Vascular: No JVD.  Cardiovascular:     Rate and Rhythm: Normal rate. Rhythm irregular.     Pulses: Intact distal pulses.     Heart sounds: Normal heart sounds. No murmur heard.   Pulmonary:     Effort: Pulmonary effort is normal.     Breath sounds: Normal breath sounds. No wheezing or rales.        Assessment & Recommendations:   79 y.o. Caucasian female with hypertension, former smoker, paroxysmal atrial fibrillation, mild to moderate MR, mild TR, exertional chest pain, dyspnea, generalized fatigue  Generalized fatigue: Still suspect that this  could be related to bradycardia from metoprolol. See below.   PAF: Episodes of Afib with RVR, along with otherwise marked bradycardia while on metoprolol.  Increase amiodarone to 200 mg daily, and additional 226m in case of Afib episodes. I also discussed with her re; switching to tikosyn, but she is wary of hospital admission. CHA2DS2VAsc score 4, annual stroke risk 5%. Continue eliquis to 2.5 mg bid, now that she is >80 yrs. Avoid NSAIDS.  CAD: While she does have LAD and RCA calcification and very likely has multivessel coronary artery disease, her stress test does not show any ischemia.   Hypertension: Controlled  She will check CMP and TSH with PCP.   F/u in 4 weeks   MAvalon MD PLindenhurst [facility] LL[facility]ardiovascular. PA Pager: [phone]: [phone] no answer Cell  [phone]

## 2019-12-06 NOTE — Telephone Encounter (Signed)
Called the number and reached a medical alert #.  Closing encounter.

## 2019-12-11 ENCOUNTER — Telehealth: Payer: Self-pay | Admitting: Pulmonary Disease

## 2019-12-11 MED ORDER — TIOTROPIUM BROMIDE-OLODATEROL 2.5-2.5 MCG/ACT IN AERS: 2.0000 | INHALATION_SPRAY | Freq: Every day | RESPIRATORY_TRACT | 6 refills | Status: DC

## 2019-12-11 NOTE — Telephone Encounter (Signed)
Spoke with the the pt and advised I have sent rx for stiolto  The wellbutrin was already sent  Nothing further needed per pt

## 2019-12-12 ENCOUNTER — Ambulatory Visit: Payer: Medicare PPO | Admitting: Cardiology

## 2019-12-16 ENCOUNTER — Telehealth: Payer: Self-pay | Admitting: Cardiology

## 2019-12-16 DIAGNOSIS — E032 Hypothyroidism due to medicaments and other exogenous substances: Secondary | ICD-10-CM | POA: Insufficient documentation

## 2019-12-16 NOTE — Telephone Encounter (Signed)
Labs 12/12/2019: Glucose 89. BUN/Cr 16/0.8. eGFR 70. Na/K 138/5.2. Rest of the CMP normal. TSH 15.3 high  Reviewed these labs with the patient. She has been started on Levothyroxine 25 mcg by her PCP.  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 I have asked her to stop amiodarone. She is reluctant to start any other antiarrhythmic therapy-her best option is tikosyn. Instead, she will go back on diltiazem 180 mg once daily. Keep appt with me in 2 weeks, where she will need repeat EKG.  Time spent: 5 min   Nigel Mormon, MD Pager: [phone] Office: [phone]

## 2019-12-25 ENCOUNTER — Ambulatory Visit: Payer: Medicare PPO | Admitting: Cardiology

## 2020-01-01 ENCOUNTER — Ambulatory Visit: Payer: Medicare PPO | Admitting: Cardiology

## 2020-01-06 ENCOUNTER — Encounter: Payer: Self-pay | Admitting: Cardiology

## 2020-01-06 ENCOUNTER — Other Ambulatory Visit: Payer: Self-pay

## 2020-01-06 ENCOUNTER — Ambulatory Visit: Payer: Medicare PPO | Admitting: Cardiology

## 2020-01-06 VITALS — BP 122/48 | HR 55 | Ht 63.0 in | Wt 135.0 lb

## 2020-01-06 DIAGNOSIS — E782 Mixed hyperlipidemia: Secondary | ICD-10-CM

## 2020-01-06 DIAGNOSIS — E032 Hypothyroidism due to medicaments and other exogenous substances: Secondary | ICD-10-CM

## 2020-01-06 DIAGNOSIS — I2510 Atherosclerotic heart disease of native coronary artery without angina pectoris: Secondary | ICD-10-CM

## 2020-01-06 DIAGNOSIS — I480 Paroxysmal atrial fibrillation: Secondary | ICD-10-CM

## 2020-01-06 MED ORDER — DILTIAZEM HCL ER COATED BEADS 180 MG PO CP24: 180.0000 mg | ORAL_CAPSULE | Freq: Every day | ORAL | 3 refills | Status: DC

## 2020-01-06 NOTE — Progress Notes (Signed)
Patient referred by [name], * for atrial fibrilaltion  Subjective:   Jill Jackson, female    DOB: 27-Nov-1940, 79 y.o.   MRN: [id number]   Chief Complaint  Patient presents with  . Atrial Fibrillation  . Follow-up    HPI  79 y.o. Caucasian female with hypertension, former smoker, paroxysmal atrial fibrillation, mild to moderate MR, mild TR, exertional chest pain, dyspnea, generalized fatigue.  Patient has noticed recent increase in palpitations, associated shortness of breath.  EKG performed in pulmonologist office on 12/05/2019 showed A. Fib. Symptoms improved with metoprolol. She denies any chest pain.  Telephone encounter 12/16/2019: Labs 12/12/2019: Glucose 89. BUN/Cr 16/0.8. eGFR 70. Na/K 138/5.2. Rest of the CMP normal. TSH 15.3 high  Reviewed these labs with the patient. She has been started on Levothyroxine 25 mcg by her PCP.  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 I have asked her to stop amiodarone. She is reluctant to start any other antiarrhythmic therapy-her best option is tikosyn. Instead, she will go back on diltiazem 180 mg once daily. Keep appt with me in 2 weeks, where she will need repeat EKG.  Patient has not had any recurrent palpitation symptoms, she has stable unchanged baseline fatigue and mild exertional dyspnea symptoms.  She is tolerating diltiazem 180 mg daily without any episodes of severely reduced heart rate.  She thinks that inhalers for her COPD, as well as bupropion are causing her "shaking" symptoms.  She is going to discuss this with Dr. Loanne Drilling.  Current Outpatient Medications on File Prior to Visit  Medication Sig Dispense Refill  . acetaminophen (TYLENOL) 500 MG tablet Take 500 mg by mouth 2 (two) times daily as needed for mild pain or headache.     Marland Kitchen apixaban (ELIQUIS) 2.5 MG TABS tablet Take 1 tablet (2.5 mg total) by mouth 2 (two) times daily. 180 tablet 2  . atorvastatin (LIPITOR) 10 MG  tablet Take 1 tablet (10 mg total) by mouth daily. 30 tablet 2  . bismuth subsalicylate (PEPTO BISMOL) 262 MG/15ML suspension Take 15 mLs by mouth every 6 (six) hours as needed (for upset stomach).     Marland Kitchen buPROPion (WELLBUTRIN SR) 150 MG 12 hr tablet Initial: 150 mg once daily for 3 days; increase to 150 mg twice daily (Patient not taking: Reported on 12/06/2019) 90 tablet 3  . diphenhydrAMINE-APAP, sleep, (TYLENOL PM EXTRA STRENGTH PO) Take 0.5 tablets by mouth at bedtime as needed (Sleep).     Marland Kitchen HYDROcodone-acetaminophen (NORCO/VICODIN) 5-325 MG tablet Take 1 tablet by mouth every 4 (four) hours as needed for moderate pain.     Marland Kitchen latanoprost (XALATAN) 0.005 % ophthalmic solution Place 1 drop into both eyes at bedtime.    Marland Kitchen lisinopril (ZESTRIL) 5 MG tablet Take 1 tablet (5 mg total) by mouth daily. 30 tablet 3  . melatonin 1 MG TABS tablet Take 3-5 mg by mouth at bedtime as needed (for sleep).    . nitroGLYCERIN (NITROSTAT) 0.4 MG SL tablet Place 1 tablet (0.4 mg total) under the tongue every 5 (five) minutes as needed for up to 25 days for chest pain. 25 tablet 3  . pantoprazole (PROTONIX) 40 MG tablet Take 40 mg by mouth daily before breakfast.     . psyllium (METAMUCIL) 58.6 % powder Take 1 packet by mouth daily as needed (GI issues- MIX AND DRINK).     . Tiotropium Bromide-Olodaterol 2.5-2.5 MCG/ACT AERS Inhale 2 puffs into the lungs daily. 1 each  6   No current facility-administered medications on file prior to visit.    Cardiovascular studies:  EKG 01/06/2020: Sinus rhythm 67 bpm Frequent PAC's  Left anterior fascicular block  Poor R-wave progression Nonspecific ST-T changes   Event monitor 09/17/2019 - 09/30/2019: Diagnostic time: 99%  Dominant rhythm: Sinus. HR 44-136 bpm. Avg HR 63 bpm. Episodes of sustained Afib with RVR up to 136 bpm Afib burden 10% No atrial flutter/SVT/VT/high grade AV block, sinus pause >3sec noted. Manually triggered episodes were accidental push,  correlated with Afib.   EKG 09/12/2019: Afib 94 bpm Poor R wave progression  Lexiscan/modified Bruce Tetrofosmin stress test 08/26/2019: Lexiscan/modified Bruce nuclear stress test performed using 1-day protocol. Stress EKG is non-diagnostic, as this is pharmacological stress test. In addition, stress EKG at 79% MPHR showed sinus tachycardia, <1 mm ST depression in inferolateral leads.  Normal myocardial perfusion. Stress LVEF 62%. Low risk study.   Echocardiogram 08/13/2019:  Normal LV systolic function with visual EF 55-60%. Left ventricle cavity  is normal in size. Mild left ventricular hypertrophy. Normal global wall  motion. Normal diastolic filling pattern, normal LAP.  Mild calcification of the mitral valve annulus. Mild mitral valve leaflet  thickening. Mildly restricted mitral valve leaflets. Moderate (Grade II)  mitral regurgitation.  Mild tricuspid regurgitation.  Mild pulmonary hypertension. RVSP measures 37 mmHg.  Mild pulmonic regurgitation.  IVC is dilated with respiratory variation.  Compared to prior study 11/23/2018 MR is now moderate and pulmonary  hypertension is new.   Carotid artery duplex 08/13/2019:  Minimal stenosis in the right internal carotid artery (1-15%).  Stenosis in the left internal carotid artery (16-49%).  Antegrade right vertebral artery flow. Antegrade left vertebral artery  flow.  Follow up in one year is appropriate if clinically indicated.  CTA Chest 07/27/2019: 1. No evidence of acute abnormality. No evidence of pulmonary emboli. 2. Coronary artery disease. LAD and RCA coronary acalcifications 3. Aortic Atherosclerosis (ICD10-I70.0) and Emphysema (ICD10-J43.9).   EKG 06/17/2019: Atrial rhythm 57 bpm. Nonspecific ST abnormalities.  Echocardiogram 11/23/2018:  Left ventricle cavity is normal in size. Normal left ventricular wall thickness. Normal LV systolic function with EF 61%. Normal global wall motion. Doppler evidence of grade I  (impaired) diastolic dysfunction, normal LAP.  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 Mild tricuspid regurgitation. Estimated pulmonary artery systolic pressure is 28 mmHg. Estimated RA pressure 8 mmHg.  EKG 11/12/2018: Sinus rhythm 81 bpm.   Left atrial enlargement.  Nonspecific ST depression.  EKG 10/18/2018: Atrial fibrillation with RVR 122 bpm. Low voltage.   Recent labs: 10/26/2019: Glucose 108, BUN/Cr 24/0.8. EGFR >60. Na/K 140/4.6. Rest of the CMP normal H/H 13/41. MCV 98. Platelets 299  11/23/2018: Glucose 93. BUN/Cr 15/0.78. eGFT 73. Na/K 140/4.8.  Chol 154, TG 106, HDL 70, LDL 63.  10/18/2018: Glucose 80. BUN/Cr 16/0.72. eGFR 80/92. Na/K 142/4.3. Rest of the CMP normal. H/H 13/40. MCV 96. TSH 5.5 High   Review of Systems  Cardiovascular: Negative for chest pain, dyspnea on exertion, leg swelling, palpitations and syncope.  Respiratory: Negative for shortness of breath.   Musculoskeletal: Positive for back pain.        Vitals:   01/06/20 1340  BP: (!) 122/48  Pulse: (!) 55     Body mass index is 23.91 kg/m. Filed Weights   01/06/20 1340  Weight: 135 lb (61.2 kg)     Objective:   Physical Exam Vitals and nursing note reviewed.  Constitutional:      Appearance: She  is well-developed.  Neck:     Vascular: No JVD.  Cardiovascular:     Rate and Rhythm: Normal rate. Rhythm irregular.     Pulses: Intact distal pulses.     Heart sounds: Normal heart sounds. No murmur heard.   Pulmonary:     Effort: Pulmonary effort is normal.     Breath sounds: Normal breath sounds. No wheezing or rales.        Assessment & Recommendations:   79 y.o. Caucasian female with hypertension, former smoker, paroxysmal atrial fibrillation, mild to moderate MR, mild TR, exertional chest pain, dyspnea, generalized fatigue  PAF: Currently in sinus rhythm.  Amiodarone stopped due to iatrogenic hypothyroidism.  Tolerating diltiazem 180  mg daily with minimal symptoms related to A. fib.  Should she have recurrent A. fib in future, her only option for antiarrhythmic management is Tikosyn.  She is not keen on hospital admission for Tikosyn, or ablation at this time. CHA2DS2VAsc score 4, annual stroke risk 5%. Continue eliquis to 2.5 mg bid. Avoid NSAIDS.  CAD: While she does have LAD and RCA calcification and very likely has multivessel coronary artery disease, her stress test does not show any ischemia.   Hypertension: Controlled  She will follow up blood work with PCP, including TSH.  Follow-up with me in 2 months  Jill Jackson Esther Hardy, MD [facility] Pager: [phone] Office: [phone] If no answer Cell [phone]

## 2020-01-27 ENCOUNTER — Other Ambulatory Visit: Payer: Self-pay | Admitting: Cardiology

## 2020-01-27 DIAGNOSIS — I480 Paroxysmal atrial fibrillation: Secondary | ICD-10-CM

## 2020-02-25 ENCOUNTER — Other Ambulatory Visit: Payer: Self-pay | Admitting: Cardiology

## 2020-02-25 DIAGNOSIS — E782 Mixed hyperlipidemia: Secondary | ICD-10-CM

## 2020-03-09 ENCOUNTER — Ambulatory Visit: Payer: Medicare PPO | Admitting: Cardiology

## 2020-03-10 NOTE — Progress Notes (Signed)
Patient referred by [name], * for atrial fibrilaltion  Subjective:   Jill Jackson, female    DOB: [birth year], 79 y.o.   MRN: [id number]   Chief Complaint  Patient presents with  . Atrial Fibrillation  . Follow-up    HPI  79 y.o. Caucasian female with hypertension, former smoker, paroxysmal atrial fibrillation, mild to moderate MR, mild TR, exertional chest pain, dyspnea, generalized fatigue.  Patient is doing well.  She not had any episodes of palpitations, chest pain.  She is working on quitting smoking.  She has noticed bilateral leg edema.  She wonders if it is related to lisinopril.  She denies any more than usual exertional dyspnea associated with it.  Current Outpatient Medications on File Prior to Visit  Medication Sig Dispense Refill  . acetaminophen (TYLENOL) 500 MG tablet Take 500 mg by mouth 2 (two) times daily as needed for mild pain or headache.     Marland Kitchen atorvastatin (LIPITOR) 10 MG tablet TAKE 1 TABLET(10 MG) BY MOUTH DAILY 30 tablet 2  . bismuth subsalicylate (PEPTO BISMOL) 262 MG/15ML suspension Take 15 mLs by mouth every 6 (six) hours as needed (for upset stomach).     Marland Kitchen buPROPion (WELLBUTRIN SR) 150 MG 12 hr tablet Initial: 150 mg once daily for 3 days; increase to 150 mg twice daily 90 tablet 3  . diltiazem (CARDIZEM CD) 180 MG 24 hr capsule Take 1 capsule (180 mg total) by mouth daily. 90 capsule 3  . diphenhydrAMINE-APAP, sleep, (TYLENOL PM EXTRA STRENGTH PO) Take 0.5 tablets by mouth at bedtime as needed (Sleep).     Marland Kitchen ELIQUIS 2.5 MG TABS tablet TAKE 1 TABLET(2.5 MG) BY MOUTH TWICE DAILY 60 tablet 2  . HYDROcodone-acetaminophen (NORCO/VICODIN) 5-325 MG tablet Take 1 tablet by mouth every 4 (four) hours as needed for moderate pain.     Marland Kitchen latanoprost (XALATAN) 0.005 % ophthalmic solution Place 1 drop into both eyes at bedtime.    Marland Kitchen levothyroxine (SYNTHROID) 50 MCG tablet Take 1 tablet by mouth daily.    Marland Kitchen lisinopril (ZESTRIL) 5 MG tablet  Take 1 tablet (5 mg total) by mouth daily. 30 tablet 3  . melatonin 1 MG TABS tablet Take 3-5 mg by mouth at bedtime as needed (for sleep).    . nitroGLYCERIN (NITROSTAT) 0.4 MG SL tablet Place 1 tablet (0.4 mg total) under the tongue every 5 (five) minutes as needed for up to 25 days for chest pain. 25 tablet 3  . pantoprazole (PROTONIX) 40 MG tablet Take 40 mg by mouth daily before breakfast.     . psyllium (METAMUCIL) 58.6 % powder Take 1 packet by mouth daily as needed (GI issues- MIX AND DRINK).     . Tiotropium Bromide-Olodaterol 2.5-2.5 MCG/ACT AERS Inhale 2 puffs into the lungs daily. 1 each 6   No current facility-administered medications on file prior to visit.    Cardiovascular studies:  EKG 01/06/2020: Sinus rhythm 67 bpm Frequent PAC's  Left anterior fascicular block  Poor R-wave progression Nonspecific ST-T changes   Event monitor 09/17/2019 - 09/30/2019: Diagnostic time: 99%  Dominant rhythm: Sinus. HR 44-136 bpm. Avg HR 63 bpm. Episodes of sustained Afib with RVR up to 136 bpm Afib burden 10% No atrial flutter/SVT/VT/high grade AV block, sinus pause >3sec noted. Manually triggered episodes were accidental push, correlated with Afib.   EKG 09/12/2019: Afib 94 bpm Poor R wave progression  Lexiscan/modified Bruce Tetrofosmin stress test 08/26/2019: Lexiscan/modified Bruce nuclear stress test performed using  1-day protocol. Stress EKG is non-diagnostic, as this is pharmacological stress test. In addition, stress EKG at 79% MPHR showed sinus tachycardia, <1 mm ST depression in inferolateral leads.  Normal myocardial perfusion. Stress LVEF 62%. Low risk study.   Echocardiogram 08/13/2019:  Normal LV systolic function with visual EF 55-60%. Left ventricle cavity  is normal in size. Mild left ventricular hypertrophy. Normal global wall  motion. Normal diastolic filling pattern, normal LAP.  Mild calcification of the mitral valve annulus. Mild mitral valve leaflet   thickening. Mildly restricted mitral valve leaflets. Moderate (Grade II)  mitral regurgitation.  Mild tricuspid regurgitation.  Mild pulmonary hypertension. RVSP measures 37 mmHg.  Mild pulmonic regurgitation.  IVC is dilated with respiratory variation.  Compared to prior study 11/23/2018 MR is now moderate and pulmonary  hypertension is new.   Carotid artery duplex 08/13/2019:  Minimal stenosis in the right internal carotid artery (1-15%).  Stenosis in the left internal carotid artery (16-49%).  Antegrade right vertebral artery flow. Antegrade left vertebral artery  flow.  Follow up in one year is appropriate if clinically indicated.  CTA Chest 07/27/2019: 1. No evidence of acute abnormality. No evidence of pulmonary emboli. 2. Coronary artery disease. LAD and RCA coronary acalcifications 3. Aortic Atherosclerosis (ICD10-I70.0) and Emphysema (ICD10-J43.9).   EKG 06/17/2019: Atrial rhythm 57 bpm. Nonspecific ST abnormalities.  Echocardiogram 11/23/2018:  Left ventricle cavity is normal in size. Normal left ventricular wall thickness. Normal LV systolic function with EF 61%. Normal global wall motion. Doppler evidence of grade I (impaired) diastolic dysfunction, normal LAP.  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 Mild tricuspid regurgitation. Estimated pulmonary artery systolic pressure is 28 mmHg. Estimated RA pressure 8 mmHg.  EKG 11/12/2018: Sinus rhythm 81 bpm.   Left atrial enlargement.  Nonspecific ST depression.  EKG 10/18/2018: Atrial fibrillation with RVR 122 bpm. Low voltage.   Recent labs: 10/26/2019: Glucose 108, BUN/Cr 24/0.8. EGFR >60. Na/K 140/4.6. Rest of the CMP normal H/H 13/41. MCV 98. Platelets 299  11/23/2018: Glucose 93. BUN/Cr 15/0.78. eGFT 73. Na/K 140/4.8.  Chol 154, TG 106, HDL 70, LDL 63.  10/18/2018: Glucose 80. BUN/Cr 16/0.72. eGFR 80/92. Na/K 142/4.3. Rest of the CMP normal. H/H 13/40. MCV 96. TSH  5.5 High   Review of Systems  Cardiovascular: Positive for leg swelling. Negative for chest pain, dyspnea on exertion, palpitations and syncope.  Respiratory: Negative for shortness of breath.   Musculoskeletal: Positive for back pain.        Vitals:   03/11/20 0937  BP: 133/60  Pulse: (!) 59  Resp: 16  SpO2: 97%     Body mass index is 25.51 kg/m. Filed Weights   03/11/20 0937  Weight: 144 lb (65.3 kg)     Objective:   Physical Exam Vitals and nursing note reviewed.  Constitutional:      Appearance: She is well-developed.  Neck:     Vascular: No JVD.  Cardiovascular:     Rate and Rhythm: Normal rate. Rhythm irregular.     Pulses: Intact distal pulses.     Heart sounds: Normal heart sounds. No murmur heard.   Pulmonary:     Effort: Pulmonary effort is normal.     Breath sounds: Normal breath sounds. No wheezing or rales.  Musculoskeletal:     Right lower leg: Edema (2+) present.     Left lower leg: Edema (2+) present.        Assessment & Recommendations:   79 y.o. Caucasian female with hypertension,  former smoker, paroxysmal atrial fibrillation, mild to moderate MR, mild TR, exertional chest pain, dyspnea, generalized fatigue   PAF: Maintaining sinus rhythm.  No recurrent paroxysmal A. fib episodes recently. H/o Amiodarone induced hypothyroidism.   Tolerating diltiazem 180 mg daily. Should she have recurrent A. fib in future, her only option for antiarrhythmic management is Tikosyn.  She is not keen on hospital admission for Tikosyn, or ablation at this time. CHA2DS2VAsc score 4, annual stroke risk 5%. Continue eliquis to 2.5 mg bid. Avoid NSAIDS.  CAD without angina: While she does have LAD and RCA calcification and very likely has multivessel coronary artery disease, her stress test does not show any ischemia.   Hypertension: Controlled  Leg edema: Stopped lisinopril and prescribed lasix for as needed use.  F/u in 3 months  Luis Llorens Torres, MD [facility] Pager: [phone] Office: [phone] If no answer Cell [phone]

## 2020-03-11 ENCOUNTER — Other Ambulatory Visit: Payer: Self-pay

## 2020-03-11 ENCOUNTER — Ambulatory Visit: Payer: Medicare PPO | Admitting: Cardiology

## 2020-03-11 ENCOUNTER — Encounter: Payer: Self-pay | Admitting: Cardiology

## 2020-03-11 VITALS — BP 133/60 | HR 59 | Resp 16 | Ht 63.0 in | Wt 144.0 lb

## 2020-03-11 DIAGNOSIS — E782 Mixed hyperlipidemia: Secondary | ICD-10-CM

## 2020-03-11 DIAGNOSIS — I480 Paroxysmal atrial fibrillation: Secondary | ICD-10-CM

## 2020-03-11 DIAGNOSIS — I2510 Atherosclerotic heart disease of native coronary artery without angina pectoris: Secondary | ICD-10-CM

## 2020-03-11 DIAGNOSIS — I10 Essential (primary) hypertension: Secondary | ICD-10-CM

## 2020-03-11 DIAGNOSIS — R600 Localized edema: Secondary | ICD-10-CM

## 2020-03-11 MED ORDER — FUROSEMIDE 20 MG PO TABS: 20.0000 mg | ORAL_TABLET | Freq: Every day | ORAL | 2 refills | Status: DC | PRN

## 2020-05-01 ENCOUNTER — Other Ambulatory Visit: Payer: Self-pay | Admitting: Cardiology

## 2020-05-01 DIAGNOSIS — I480 Paroxysmal atrial fibrillation: Secondary | ICD-10-CM

## 2020-05-05 ENCOUNTER — Other Ambulatory Visit: Payer: Self-pay

## 2020-05-05 DIAGNOSIS — I480 Paroxysmal atrial fibrillation: Secondary | ICD-10-CM

## 2020-05-05 MED ORDER — APIXABAN 2.5 MG PO TABS: 2.5000 mg | ORAL_TABLET | Freq: Two times a day (BID) | ORAL | 2 refills | Status: DC

## 2020-06-09 ENCOUNTER — Other Ambulatory Visit: Payer: Self-pay | Admitting: Cardiology

## 2020-06-09 ENCOUNTER — Ambulatory Visit: Payer: Medicare PPO | Admitting: Pulmonary Disease

## 2020-06-09 DIAGNOSIS — E782 Mixed hyperlipidemia: Secondary | ICD-10-CM

## 2020-06-19 ENCOUNTER — Ambulatory Visit: Payer: Medicare PPO | Admitting: Cardiology

## 2020-06-19 ENCOUNTER — Encounter: Payer: Self-pay | Admitting: Cardiology

## 2020-06-19 ENCOUNTER — Other Ambulatory Visit: Payer: Self-pay

## 2020-06-19 VITALS — BP 152/71 | HR 73 | Temp 98.0°F | Resp 16 | Ht 63.0 in | Wt 151.0 lb

## 2020-06-19 DIAGNOSIS — I480 Paroxysmal atrial fibrillation: Secondary | ICD-10-CM

## 2020-06-19 DIAGNOSIS — I2510 Atherosclerotic heart disease of native coronary artery without angina pectoris: Secondary | ICD-10-CM

## 2020-06-19 DIAGNOSIS — E782 Mixed hyperlipidemia: Secondary | ICD-10-CM

## 2020-06-19 DIAGNOSIS — I10 Essential (primary) hypertension: Secondary | ICD-10-CM

## 2020-06-19 MED ORDER — SPIRONOLACTONE 25 MG PO TABS: 25.0000 mg | ORAL_TABLET | Freq: Every day | ORAL | 3 refills | Status: DC

## 2020-06-19 NOTE — Progress Notes (Signed)
Patient referred by [name], * for atrial fibrilaltion  Subjective:   Jill Jackson, female    DOB: Jul 05, 1940, 80 y.o.   MRN: [id number]   Chief Complaint  Patient presents with  . Paroxysmal atrial fibrillation   . Follow-up    HPI  80 y.o. Caucasian female with hypertension, former smoker, paroxysmal atrial fibrillation, mild to moderate MR, mild TR, exertional chest pain, dyspnea, generalized fatigue.  Patient has noted recurrent episodes of palpitations.  However, she continues to have bilateral leg swelling.  Exertional dyspnea is unchanged and stable.  Current Outpatient Medications on File Prior to Visit  Medication Sig Dispense Refill  . acetaminophen (TYLENOL) 500 MG tablet Take 500 mg by mouth 2 (two) times daily as needed for mild pain or headache.     Marland Kitchen apixaban (ELIQUIS) 2.5 MG TABS tablet Take 1 tablet (2.5 mg total) by mouth 2 (two) times daily. 60 tablet 2  . atorvastatin (LIPITOR) 10 MG tablet TAKE 1 TABLET(10 MG) BY MOUTH DAILY 30 tablet 2  . buPROPion (WELLBUTRIN SR) 150 MG 12 hr tablet Initial: 150 mg once daily for 3 days; increase to 150 mg twice daily 90 tablet 3  . diltiazem (CARDIZEM CD) 180 MG 24 hr capsule Take 1 capsule (180 mg total) by mouth daily. 90 capsule 3  . diphenhydrAMINE-APAP, sleep, (TYLENOL PM EXTRA STRENGTH PO) Take 0.5 tablets by mouth at bedtime as needed (Sleep).     . furosemide (LASIX) 20 MG tablet Take 1 tablet (20 mg total) by mouth daily as needed. 60 tablet 2  . HYDROcodone-acetaminophen (NORCO/VICODIN) 5-325 MG tablet Take 1 tablet by mouth every 4 (four) hours as needed for moderate pain.     Marland Kitchen latanoprost (XALATAN) 0.005 % ophthalmic solution Place 1 drop into both eyes at bedtime.    Marland Kitchen levothyroxine (SYNTHROID) 50 MCG tablet Take 1 tablet by mouth daily.    Marland Kitchen lisinopril (ZESTRIL) 5 MG tablet Take 5 mg by mouth as needed.    . melatonin 1 MG TABS tablet Take 3-5 mg by mouth at bedtime as needed (for  sleep).    . nitroGLYCERIN (NITROSTAT) 0.4 MG SL tablet Place 1 tablet (0.4 mg total) under the tongue every 5 (five) minutes as needed for up to 25 days for chest pain. 25 tablet 3  . pantoprazole (PROTONIX) 40 MG tablet Take 40 mg by mouth daily before breakfast.     . psyllium (METAMUCIL) 58.6 % packet Take 1 packet by mouth as needed.    . Tiotropium Bromide-Olodaterol 2.5-2.5 MCG/ACT AERS Inhale 2 puffs into the lungs daily. 1 each 6   No current facility-administered medications on file prior to visit.    Cardiovascular studies:  EKG 06/19/2020: Sinus rhythm 74 bpm Possible old anteroseptal infarct   Event monitor 09/17/2019 - 09/30/2019: Diagnostic time: 99%  Dominant rhythm: Sinus. HR 44-136 bpm. Avg HR 63 bpm. Episodes of sustained Afib with RVR up to 136 bpm Afib burden 10% No atrial flutter/SVT/VT/high grade AV block, sinus pause >3sec noted. Manually triggered episodes were accidental push, correlated with Afib.  Lexiscan/modified Bruce Tetrofosmin stress test 08/26/2019: Lexiscan/modified Bruce nuclear stress test performed using 1-day protocol. Stress EKG is non-diagnostic, as this is pharmacological stress test. In addition, stress EKG at 79% MPHR showed sinus tachycardia, <1 mm ST depression in inferolateral leads.  Normal myocardial perfusion. Stress LVEF 62%. Low risk study.   Echocardiogram 08/13/2019:  Normal LV systolic function with visual EF 55-60%. Left ventricle cavity  is normal in size. Mild left ventricular hypertrophy. Normal global wall  motion. Normal diastolic filling pattern, normal LAP.  Mild calcification of the mitral valve annulus. Mild mitral valve leaflet  thickening. Mildly restricted mitral valve leaflets. Moderate (Grade II)  mitral regurgitation.  Mild tricuspid regurgitation.  Mild pulmonary hypertension. RVSP measures 37 mmHg.  Mild pulmonic regurgitation.  IVC is dilated with respiratory variation.  Compared to prior study 11/23/2018  MR is now moderate and pulmonary  hypertension is new.   Carotid artery duplex 08/13/2019:  Minimal stenosis in the right internal carotid artery (1-15%).  Stenosis in the left internal carotid artery (16-49%).  Antegrade right vertebral artery flow. Antegrade left vertebral artery  flow.  Follow up in one year is appropriate if clinically indicated.  CTA Chest 07/27/2019: 1. No evidence of acute abnormality. No evidence of pulmonary emboli. 2. Coronary artery disease. LAD and RCA coronary acalcifications 3. Aortic Atherosclerosis (ICD10-I70.0) and Emphysema (ICD10-J43.9).    Recent labs: 10/26/2019: Glucose 108, BUN/Cr 24/0.8. EGFR >60. Na/K 140/4.6. Rest of the CMP normal H/H 13/41. MCV 98. Platelets 299  11/23/2018: Glucose 93. BUN/Cr 15/0.78. eGFT 73. Na/K 140/4.8.  Chol 154, TG 106, HDL 70, LDL 63.  10/18/2018: Glucose 80. BUN/Cr 16/0.72. eGFR 80/92. Na/K 142/4.3. Rest of the CMP normal. H/H 13/40. MCV 96. TSH 5.5 High   Review of Systems  Cardiovascular: Positive for leg swelling. Negative for chest pain, dyspnea on exertion, palpitations and syncope.  Respiratory: Negative for shortness of breath.   Musculoskeletal: Positive for back pain.        Vitals:   06/19/20 1134 06/19/20 1146  BP: (!) 169/88 (!) 152/71  Pulse: 76 73  Resp: 16   Temp: 98 F (36.7 C)   SpO2: 96%      Body mass index is 26.75 kg/m. Filed Weights   06/19/20 1134  Weight: 151 lb (68.5 kg)     Objective:   Physical Exam Vitals and nursing note reviewed.  Constitutional:      Appearance: She is well-developed.  Neck:     Vascular: No JVD.  Cardiovascular:     Rate and Rhythm: Normal rate. Rhythm irregular.     Pulses: Intact distal pulses.     Heart sounds: Normal heart sounds. No murmur heard.   Pulmonary:     Effort: Pulmonary effort is normal.     Breath sounds: Normal breath sounds. No wheezing or rales.  Musculoskeletal:     Right lower leg: Edema (2+) present.      Left lower leg: Edema (2+) present.        Assessment & Recommendations:   80 y.o. Caucasian female with hypertension, former smoker, paroxysmal atrial fibrillation, nonobstructive CAD   Leg edema: Suspect secondary to HFpEF Stop lisinopril.  Start spironolactone 20 mg daily.  Repeat BMP in 1 week. Echocardiogram result pending.  PAF: Maintaining sinus rhythm.  No recurrent paroxysmal A. fib episodes recently. H/o Amiodarone induced hypothyroidism.   Tolerating diltiazem 180 mg daily. Should she have recurrent A. fib in future, her only option for antiarrhythmic management is Tikosyn.  She is not keen on hospital admission for Tikosyn, or ablation at this time. CHA2DS2VAsc score 4, annual stroke risk 5%. Continue eliquis to 2.5 mg bid. Avoid NSAIDS.  CAD without angina: While she does have LAD and RCA calcification and very likely has multivessel coronary artery disease, her stress test does not show any ischemia.   Hypertension:  Blood pressure elevated today, usually normal.  Hopefully  spironolactone will control blood pressure better.  F/u in 4 weeks   [place], MD [facility] Pager: [phone] Office: [phone] If no answer Cell [phone]

## 2020-07-02 IMAGING — CT CT ABD-PELV W/ CM
2 of 5 series · 14 of 46 positions shown, 16 images · IV contrast (iopamidol)
Comparison: None.

CLINICAL DATA: Generalized abdominal pain for several months.

EXAM:
CT ABDOMEN AND PELVIS WITH CONTRAST
TECHNIQUE: Multidetector CT imaging of the abdomen and pelvis was performed
using the standard protocol following bolus administration of
intravenous contrast.
CONTRAST:  100mL I47IBG-WBB IOPAMIDOL (I47IBG-WBB) INJECTION 61%

[Series 2: abd pelvis 5.00 br40 s3 axial · axial · 0.54mm/px · z∈[+1389,+1759]mm · 11 of 84 slices shown, 13 images]
[im 5/84  soft-tissue]
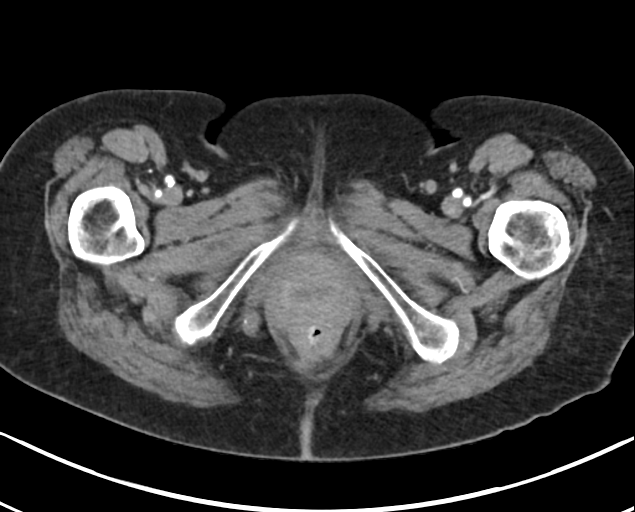
[im 5/84  bone]
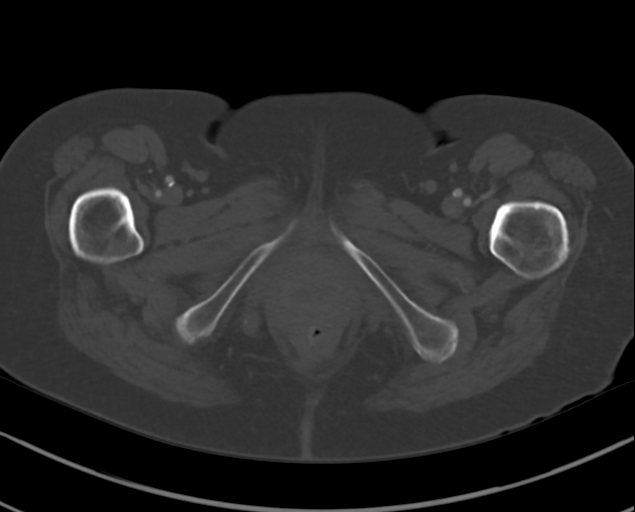
[im 14/84  soft-tissue]
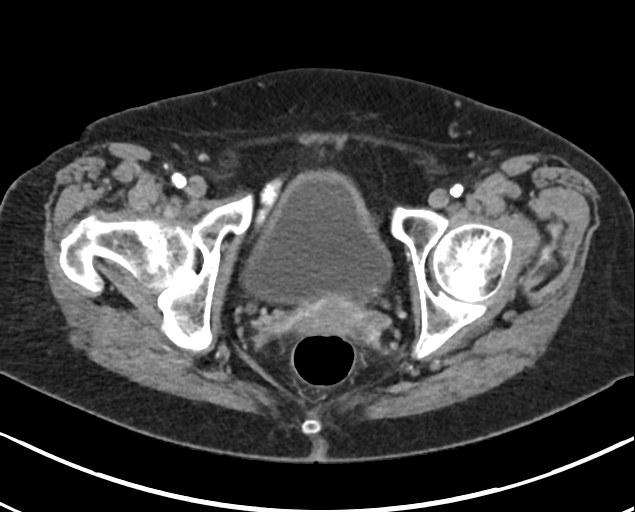
[im 19/84  soft-tissue]
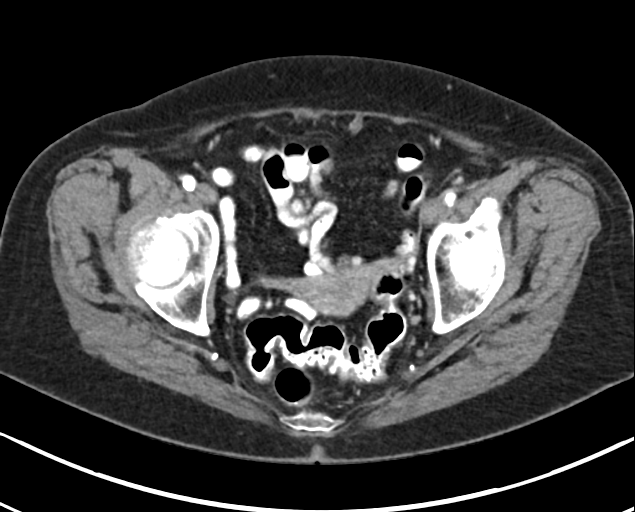
[im 28/84  soft-tissue]
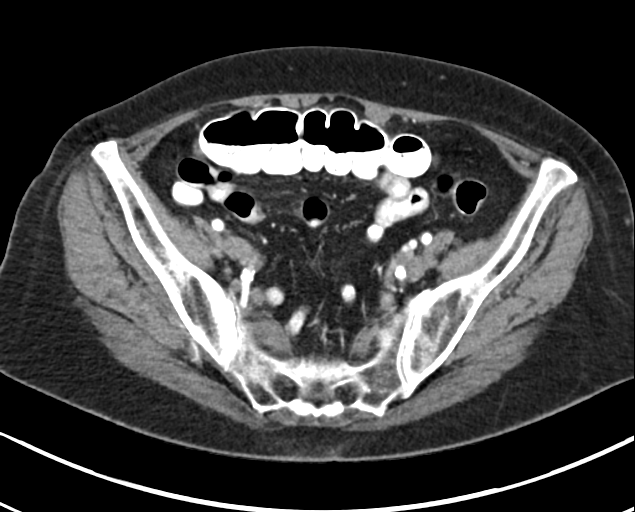
[im 33/84  soft-tissue]
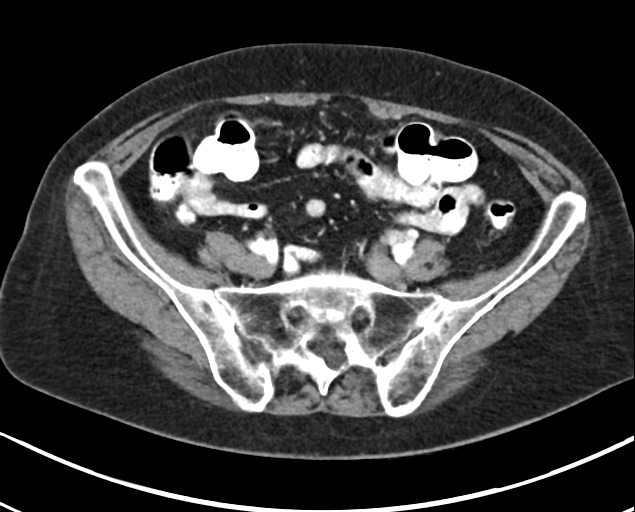
[im 42/84  soft-tissue]
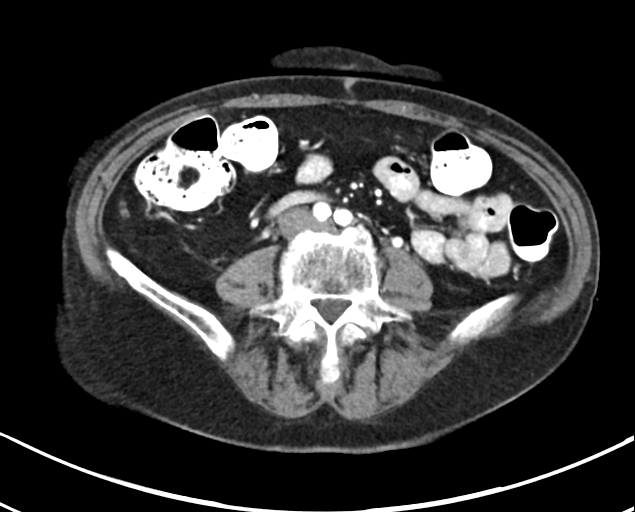
[im 51/84  soft-tissue]
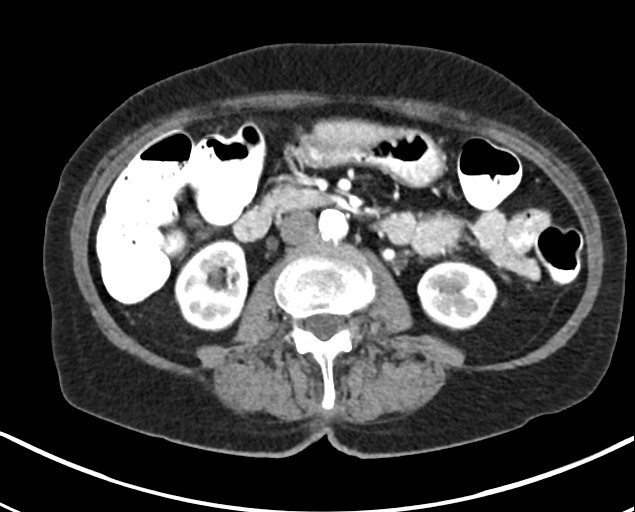
[im 56/84  soft-tissue]
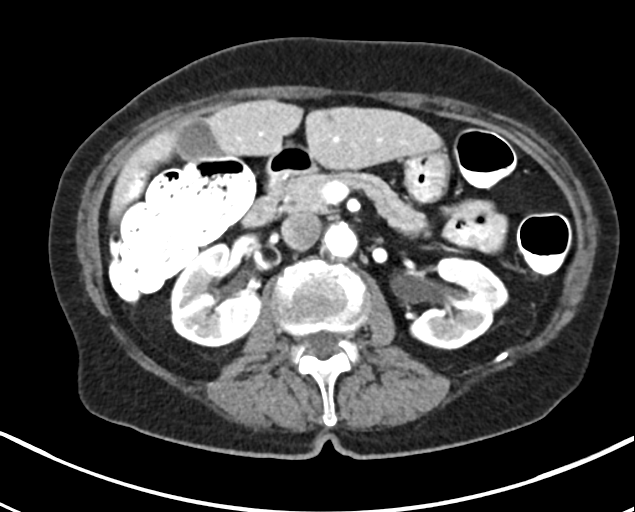
[im 65/84  soft-tissue]
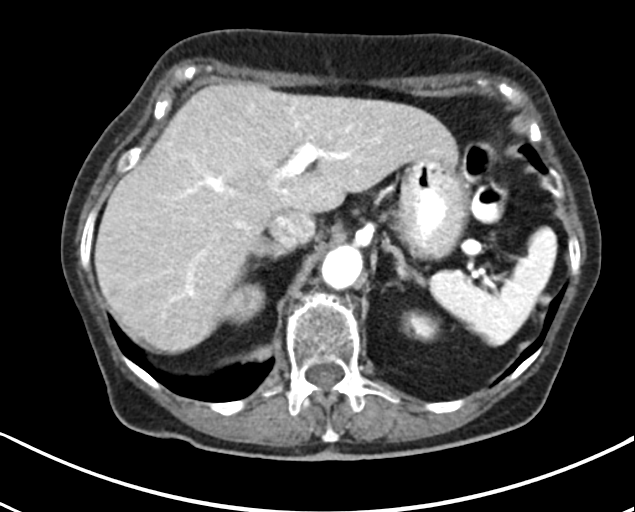
[im 65/84  bone]
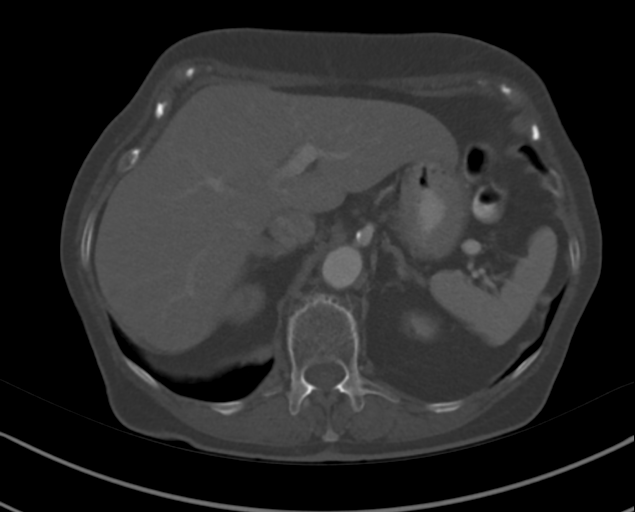
[im 70/84  soft-tissue]
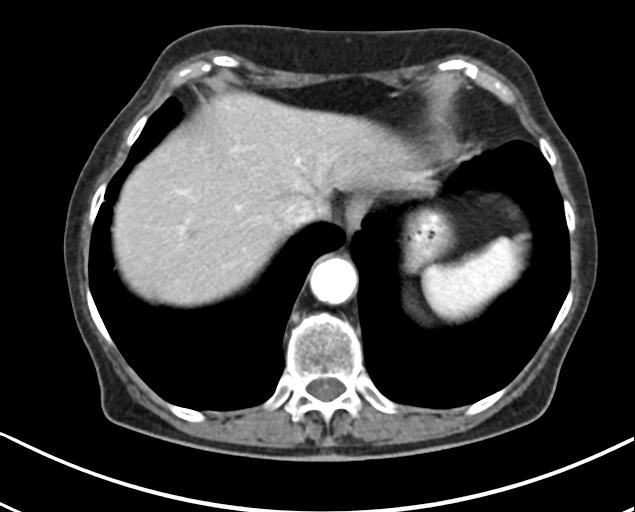
[im 79/84  soft-tissue]
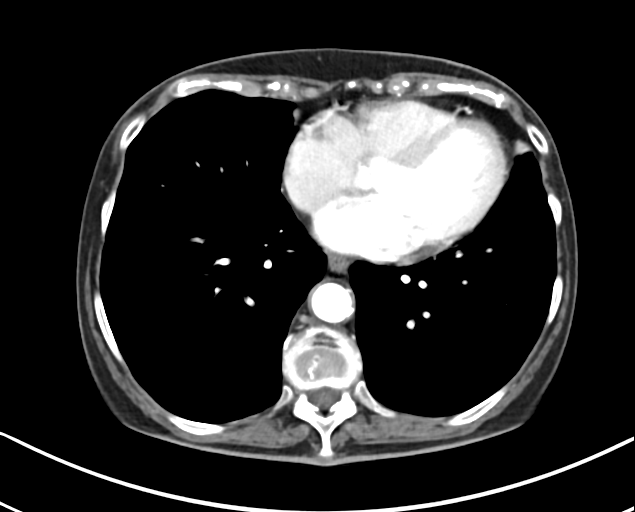

[Series 6: abd pelvis 2.00 br40 s3 cor · coronal · 0.67mm/px · 3 of 137 slices shown]
[im 46/137  soft-tissue]
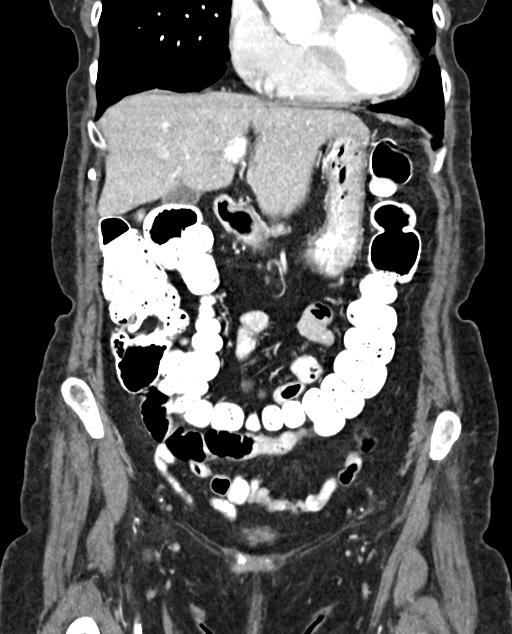
[im 61/137  soft-tissue]
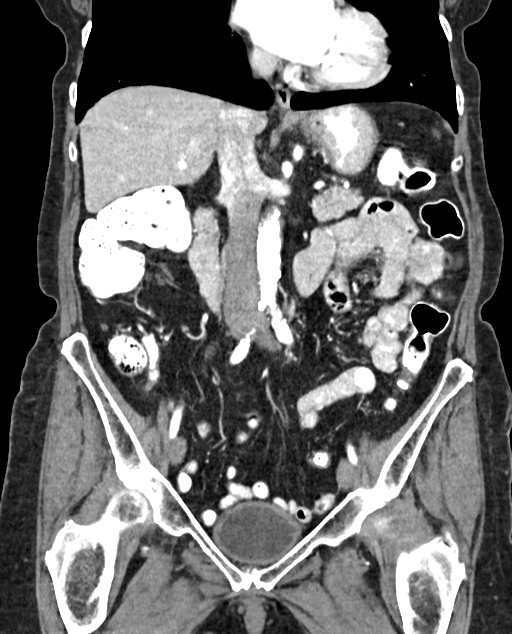
[im 76/137  soft-tissue]
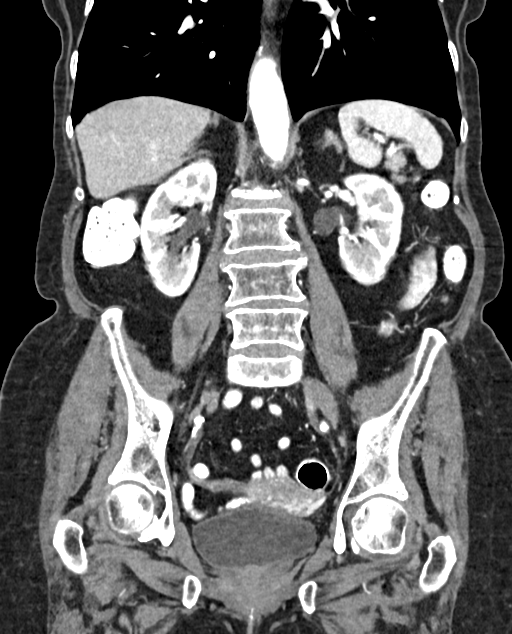

[14 of 46 positions shown; findings below may reference images not displayed]

FINDINGS: Lower chest: No acute abnormality.

Hepatobiliary: No focal liver abnormality is seen. No gallstones,
gallbladder wall thickening, or biliary dilatation.

Pancreas: Unremarkable. No pancreatic ductal dilatation or
surrounding inflammatory changes.

Spleen: Normal in size without focal abnormality.

Adrenals/Urinary Tract: Adrenal glands are unremarkable. Kidneys are
normal, without renal calculi, focal lesion, or hydronephrosis.
Bladder is unremarkable.

Stomach/Bowel: Stomach is within normal limits. Appendix appears
normal. No evidence of bowel wall thickening, distention, or
inflammatory changes.

Vascular/Lymphatic: Aortic atherosclerosis. No enlarged abdominal or
pelvic lymph nodes.

Reproductive: Uterus and bilateral adnexa are unremarkable.

Other: No abdominal wall hernia or abnormality. No abdominopelvic
ascites.

Musculoskeletal: No acute or significant osseous findings.
IMPRESSION: No acute abnormality seen in the abdomen or pelvis.

Aortic Atherosclerosis (LCRCC-CHG.G).

## 2020-07-23 ENCOUNTER — Ambulatory Visit: Payer: Medicare PPO | Admitting: Pulmonary Disease

## 2020-07-24 ENCOUNTER — Ambulatory Visit: Payer: Medicare PPO | Admitting: Cardiology

## 2020-07-29 ENCOUNTER — Encounter: Payer: Self-pay | Admitting: Cardiology

## 2020-07-29 ENCOUNTER — Other Ambulatory Visit: Payer: Self-pay

## 2020-07-29 ENCOUNTER — Ambulatory Visit: Payer: Medicare PPO | Admitting: Cardiology

## 2020-07-29 VITALS — BP 135/72 | HR 65 | Temp 98.6°F | Resp 16 | Ht 63.0 in | Wt 146.0 lb

## 2020-07-29 DIAGNOSIS — I2510 Atherosclerotic heart disease of native coronary artery without angina pectoris: Secondary | ICD-10-CM

## 2020-07-29 DIAGNOSIS — I480 Paroxysmal atrial fibrillation: Secondary | ICD-10-CM

## 2020-07-29 DIAGNOSIS — E782 Mixed hyperlipidemia: Secondary | ICD-10-CM

## 2020-07-29 NOTE — Progress Notes (Signed)
Patient referred by [name], * for atrial fibrilaltion  Subjective:   Jill Jackson, female    DOB: 1940-08-30, 80 y.o.   MRN: [id number]   Chief Complaint  Patient presents with  . Leg Swelling  . Atrial Fibrillation  . Follow-up    4 weeks    HPI  80 y.o. Caucasian female with hypertension, former smoker, paroxysmal atrial fibrillation, nonobstructive CAD   Leg edema has improved after starting spironolactone. She is doing well and has no new symptoms.  Recent labs reviewed with the patient, details below.  Current Outpatient Medications on File Prior to Visit  Medication Sig Dispense Refill  . acetaminophen (TYLENOL) 500 MG tablet Take 500 mg by mouth 2 (two) times daily as needed for mild pain or headache.     Marland Kitchen apixaban (ELIQUIS) 2.5 MG TABS tablet Take 1 tablet (2.5 mg total) by mouth 2 (two) times daily. 60 tablet 2  . atorvastatin (LIPITOR) 10 MG tablet TAKE 1 TABLET(10 MG) BY MOUTH DAILY 30 tablet 2  . buPROPion (WELLBUTRIN SR) 150 MG 12 hr tablet Initial: 150 mg once daily for 3 days; increase to 150 mg twice daily 90 tablet 3  . diltiazem (CARDIZEM CD) 180 MG 24 hr capsule Take 1 capsule (180 mg total) by mouth daily. 90 capsule 3  . diphenhydrAMINE-APAP, sleep, (TYLENOL PM EXTRA STRENGTH PO) Take 0.5 tablets by mouth at bedtime as needed (Sleep).     . furosemide (LASIX) 20 MG tablet Take 1 tablet (20 mg total) by mouth daily as needed. 60 tablet 2  . HYDROcodone-acetaminophen (NORCO/VICODIN) 5-325 MG tablet Take 1 tablet by mouth every 4 (four) hours as needed for moderate pain.     Marland Kitchen latanoprost (XALATAN) 0.005 % ophthalmic solution Place 1 drop into both eyes at bedtime.    Marland Kitchen levothyroxine (SYNTHROID) 50 MCG tablet Take 1 tablet by mouth daily.    . melatonin 1 MG TABS tablet Take 3-5 mg by mouth at bedtime as needed (for sleep).    . nitroGLYCERIN (NITROSTAT) 0.4 MG SL tablet Place 1 tablet (0.4 mg total) under the tongue every 5 (five)  minutes as needed for up to 25 days for chest pain. 25 tablet 3  . pantoprazole (PROTONIX) 40 MG tablet Take 40 mg by mouth daily before breakfast.     . psyllium (METAMUCIL) 58.6 % packet Take 1 packet by mouth as needed.    Marland Kitchen spironolactone (ALDACTONE) 25 MG tablet Take 1 tablet (25 mg total) by mouth daily. 30 tablet 3  . Tiotropium Bromide-Olodaterol 2.5-2.5 MCG/ACT AERS Inhale 2 puffs into the lungs daily. 1 each 6   No current facility-administered medications on file prior to visit.    Cardiovascular studies:  EKG 06/19/2020: Sinus rhythm 74 bpm Possible old anteroseptal infarct   Event monitor 09/17/2019 - 09/30/2019: Diagnostic time: 99%  Dominant rhythm: Sinus. HR 44-136 bpm. Avg HR 63 bpm. Episodes of sustained Afib with RVR up to 136 bpm Afib burden 10% No atrial flutter/SVT/VT/high grade AV block, sinus pause >3sec noted. Manually triggered episodes were accidental push, correlated with Afib.  Lexiscan/modified Bruce Tetrofosmin stress test 08/26/2019: Lexiscan/modified Bruce nuclear stress test performed using 1-day protocol. Stress EKG is non-diagnostic, as this is pharmacological stress test. In addition, stress EKG at 79% MPHR showed sinus tachycardia, <1 mm ST depression in inferolateral leads.  Normal myocardial perfusion. Stress LVEF 62%. Low risk study.   Echocardiogram 08/13/2019:  Normal LV systolic function with visual EF 55-60%.  Left ventricle cavity  is normal in size. Mild left ventricular hypertrophy. Normal global wall  motion. Normal diastolic filling pattern, normal LAP.  Mild calcification of the mitral valve annulus. Mild mitral valve leaflet  thickening. Mildly restricted mitral valve leaflets. Moderate (Grade II)  mitral regurgitation.  Mild tricuspid regurgitation.  Mild pulmonary hypertension. RVSP measures 37 mmHg.  Mild pulmonic regurgitation.  IVC is dilated with respiratory variation.  Compared to prior study 11/23/2018 MR is now moderate  and pulmonary  hypertension is new.   Carotid artery duplex 08/13/2019:  Minimal stenosis in the right internal carotid artery (1-15%).  Stenosis in the left internal carotid artery (16-49%).  Antegrade right vertebral artery flow. Antegrade left vertebral artery  flow.  Follow up in one year is appropriate if clinically indicated.  CTA Chest 07/27/2019: 1. No evidence of acute abnormality. No evidence of pulmonary emboli. 2. Coronary artery disease. LAD and RCA coronary acalcifications 3. Aortic Atherosclerosis (ICD10-I70.0) and Emphysema (ICD10-J43.9).    Recent labs: 07/08/2020: Glucose 99, BUN/Cr 20/0.97. EGFR 59. Na/K 136/4.9.   10/26/2019: Glucose 108, BUN/Cr 24/0.8. EGFR >60. Na/K 140/4.6. Rest of the CMP normal H/H 13/41. MCV 98. Platelets 299  11/23/2018: Glucose 93. BUN/Cr 15/0.78. eGFT 73. Na/K 140/4.8.  Chol 154, TG 106, HDL 70, LDL 63.  10/18/2018: Glucose 80. BUN/Cr 16/0.72. eGFR 80/92. Na/K 142/4.3. Rest of the CMP normal. H/H 13/40. MCV 96. TSH 5.5 High   Review of Systems  Cardiovascular: Positive for leg swelling. Negative for chest pain, dyspnea on exertion, palpitations and syncope.  Respiratory: Negative for shortness of breath.   Musculoskeletal: Positive for back pain.        Vitals:   07/29/20 1121  BP: 135/72  Pulse: 65  Resp: 16  Temp: 98.6 F (37 C)  SpO2: 98%     Body mass index is 25.86 kg/m. Filed Weights   07/29/20 1121  Weight: 146 lb (66.2 kg)     Objective:   Physical Exam Vitals and nursing note reviewed.  Constitutional:      Appearance: She is well-developed.  Neck:     Vascular: No JVD.  Cardiovascular:     Rate and Rhythm: Normal rate. Rhythm irregular.     Pulses: Intact distal pulses.     Heart sounds: Normal heart sounds. No murmur heard.   Pulmonary:     Effort: Pulmonary effort is normal.     Breath sounds: Normal breath sounds. No wheezing or rales.  Musculoskeletal:     Right lower leg: Edema  (2+) present.     Left lower leg: Edema (2+) present.        Assessment & Recommendations:   80 y.o. Caucasian female with hypertension, former smoker, paroxysmal atrial fibrillation, nonobstructive CAD   Leg edema: Suspect secondary to HFpEF Improved with spironolactone 25 mg. Tolerating well.  PAF: Maintaining sinus rhythm.  No recurrent paroxysmal A. fib episodes recently. H/o Amiodarone induced hypothyroidism.   Tolerating diltiazem 180 mg daily. Should she have recurrent A. fib in future, her only option for antiarrhythmic management is Tikosyn.  She is not keen on hospital admission for Tikosyn, or ablation at this time. CHA2DS2VAsc score 4, annual stroke risk 5%. Continue eliquis to 2.5 mg bid. Avoid NSAIDS.  CAD without angina: While she does have LAD and RCA calcification and very likely has multivessel coronary artery disease, her stress test does not show any ischemia.   Hypertension: Controlled  F/u in 3 months  [place], MD [facility].  [place] Pager: [phone] Office: [phone] If no answer Cell [phone]

## 2020-08-08 ENCOUNTER — Other Ambulatory Visit: Payer: Self-pay | Admitting: Cardiology

## 2020-08-08 DIAGNOSIS — I480 Paroxysmal atrial fibrillation: Secondary | ICD-10-CM

## 2020-08-08 DIAGNOSIS — E782 Mixed hyperlipidemia: Secondary | ICD-10-CM

## 2020-08-20 DIAGNOSIS — Z7901 Long term (current) use of anticoagulants: Secondary | ICD-10-CM | POA: Insufficient documentation

## 2020-08-20 DIAGNOSIS — K219 Gastro-esophageal reflux disease without esophagitis: Secondary | ICD-10-CM | POA: Insufficient documentation

## 2020-08-20 DIAGNOSIS — I480 Paroxysmal atrial fibrillation: Secondary | ICD-10-CM | POA: Insufficient documentation

## 2020-08-21 ENCOUNTER — Ambulatory Visit: Payer: Medicare PPO | Admitting: Pulmonary Disease

## 2020-08-21 ENCOUNTER — Other Ambulatory Visit: Payer: Self-pay

## 2020-08-21 ENCOUNTER — Ambulatory Visit (INDEPENDENT_AMBULATORY_CARE_PROVIDER_SITE_OTHER): Payer: Medicare PPO | Admitting: Pulmonary Disease

## 2020-08-21 ENCOUNTER — Encounter: Payer: Self-pay | Admitting: Pulmonary Disease

## 2020-08-21 VITALS — BP 118/60 | HR 65 | Ht 63.0 in | Wt 146.0 lb

## 2020-08-21 DIAGNOSIS — J449 Chronic obstructive pulmonary disease, unspecified: Secondary | ICD-10-CM

## 2020-08-21 DIAGNOSIS — R0602 Shortness of breath: Secondary | ICD-10-CM | POA: Diagnosis not present

## 2020-08-21 DIAGNOSIS — J432 Centrilobular emphysema: Secondary | ICD-10-CM | POA: Diagnosis not present

## 2020-08-21 LAB — PULMONARY FUNCTION TEST
DL/VA % pred: 63 %
DL/VA: 2.98 ml/min/mmHg/L
DLCO cor % pred: 54 %
DLCO cor: 12.57 ml/min/mmHg
DLCO unc % pred: 54 %
DLCO unc: 12.57 ml/min/mmHg
FEF 25-75 Post: 0.51 L/s
FEF 25-75 Pre: 0.37 L/s
FEF2575-%Change-Post: 38 %
FEF2575-%Pred-Post: 33 %
FEF2575-%Pred-Pre: 24 %
FEV1-%Change-Post: 13 %
FEV1-%Pred-Post: 60 %
FEV1-%Pred-Pre: 53 %
FEV1-Post: 1.14 L
FEV1-Pre: 1.01 L
FEV1FVC-%Change-Post: 5 %
FEV1FVC-%Pred-Pre: 61 %
FEV6-%Change-Post: 9 %
FEV6-%Pred-Post: 90 %
FEV6-%Pred-Pre: 82 %
FEV6-Post: 2.13 L
FEV6-Pre: 1.94 L
FEV6FVC-%Change-Post: 3 %
FEV6FVC-%Pred-Post: 99 %
FEV6FVC-%Pred-Pre: 96 %
FVC-%Change-Post: 7 %
FVC-%Pred-Post: 92 %
FVC-%Pred-Pre: 85 %
FVC-Post: 2.26 L
FVC-Pre: 2.11 L
Post FEV1/FVC ratio: 50 %
Post FEV6/FVC ratio: 95 %
Pre FEV1/FVC ratio: 48 %
Pre FEV6/FVC Ratio: 92 %
RV % pred: 189 %
RV: 4.44 L
TLC % pred: 128 %
TLC: 6.32 L

## 2020-08-21 MED ORDER — LEVALBUTEROL TARTRATE 45 MCG/ACT IN AERO: INHALATION_SPRAY | RESPIRATORY_TRACT | 12 refills | Status: DC

## 2020-08-21 MED ORDER — TIOTROPIUM BROMIDE-OLODATEROL 2.5-2.5 MCG/ACT IN AERS: 2.0000 | INHALATION_SPRAY | Freq: Every day | RESPIRATORY_TRACT | 6 refills | Status: DC

## 2020-08-21 NOTE — Progress Notes (Signed)
Subjective:   PATIENT ID: Jill Jackson GENDER: female DOB: 27-Mar-1941, MRN: [id number]   Shortness of Breath Associated symptoms include wheezing. Pertinent negatives include no chest pain, fever, hemoptysis, leg swelling or sputum production.    Chief Complaint  Patient presents with  . Shortness of Breath    PFT today    Reason for Visit: Follow-up for shortness of breath  [name] is a 80 year old female with atrial fibrillation who presents for follow-up  Synopsis: Initially seen for consult on 12/2019. For the last 6-8 months she has severe dyspnea on exertion. Has occasional wheezing. Denies coughing. Before she would feel fatigued but now she has to stop and rest multiple times including when retrieving her mail. She has had to alternate showering every other day and finds herself sitting on her bed frequently to rest between activities. She feels weak and helpless at times. Her dyspnea is associated with palpitations but not every time. She reports recent sensation of her heart racing in the last few days and is planning to discuss with her cardiologist.   08/21/20 Previously took one puff daily with no significant change in her symptoms. Realized she should should be taking taking two puffs daily and recently started that. No visual changes. Plans to see ophthalmologist in August. Continues to smoke daily  Social History: Active smoker  I have personally reviewed patient's past medical/family/social history/allergies/current medications.   Past Medical History:  Diagnosis Date  . Atrial fibrillation (HCC) 11/01/2018  . HLD (hyperlipidemia) 11/01/2018  . Hypertension      Outpatient Medications Prior to Visit  Medication Sig Dispense Refill  . acetaminophen (TYLENOL) 500 MG tablet Take 500 mg by mouth 2 (two) times daily as needed for mild pain or headache.     Marland Kitchen atorvastatin (LIPITOR) 10 MG tablet TAKE 1 TABLET(10 MG) BY MOUTH DAILY 90 tablet 0   . buPROPion (WELLBUTRIN SR) 150 MG 12 hr tablet Initial: 150 mg once daily for 3 days; increase to 150 mg twice daily 90 tablet 3  . diltiazem (CARDIZEM CD) 180 MG 24 hr capsule Take 1 capsule (180 mg total) by mouth daily. 90 capsule 3  . diphenhydrAMINE-APAP, sleep, (TYLENOL PM EXTRA STRENGTH PO) Take 0.5 tablets by mouth at bedtime as needed (Sleep).     Marland Kitchen ELIQUIS 2.5 MG TABS tablet TAKE 1 TABLET(2.5 MG) BY MOUTH TWICE DAILY 60 tablet 2  . ELIQUIS 2.5 MG TABS tablet TAKE 1 TABLET(2.5 MG) BY MOUTH TWICE DAILY 180 tablet 0  . HYDROcodone-acetaminophen (NORCO/VICODIN) 5-325 MG tablet Take 1 tablet by mouth every 4 (four) hours as needed for moderate pain.     Marland Kitchen latanoprost (XALATAN) 0.005 % ophthalmic solution Place 1 drop into both eyes at bedtime.    Marland Kitchen levothyroxine (SYNTHROID) 50 MCG tablet Take 1 tablet by mouth daily.    . Loratadine (CLARITIN PO) Take by mouth.    . melatonin 1 MG TABS tablet Take 3-5 mg by mouth at bedtime as needed (for sleep).    . nitroGLYCERIN (NITROSTAT) 0.4 MG SL tablet Place 1 tablet (0.4 mg total) under the tongue every 5 (five) minutes as needed for up to 25 days for chest pain. 25 tablet 3  . pantoprazole (PROTONIX) 40 MG tablet Take 40 mg by mouth daily before breakfast.     . psyllium (METAMUCIL) 58.6 % packet Take 1 packet by mouth as needed.    Marland Kitchen spironolactone (ALDACTONE) 25 MG tablet Take 1 tablet (25 mg  total) by mouth daily. 30 tablet 3  . Tiotropium Bromide-Olodaterol 2.5-2.5 MCG/ACT AERS Inhale 2 puffs into the lungs daily. 1 each 6   No facility-administered medications prior to visit.    Review of Systems  Constitutional: Negative for chills, diaphoresis, fever, malaise/fatigue and weight loss.  HENT: Negative for congestion.   Eyes: Negative for blurred vision, double vision, pain and redness.  Respiratory: Positive for shortness of breath and wheezing. Negative for cough, hemoptysis and sputum production.   Cardiovascular: Negative for chest  pain, palpitations and leg swelling.     Objective:   Vitals:   08/21/20 1104  BP: 118/60  Pulse: 65  SpO2: 97%  Weight: 146 lb (66.2 kg)  Height: 5\' 3"  (1.6 m)   SpO2: 97 %  Physical Exam: General: Elderly-appearing, no acute distress HENT: [place], AT Eyes: EOMI, no scleral icterus Respiratory: Clear to auscultation bilaterally.  No crackles, wheezing or rales Cardiovascular: Irregularly irregular, -M/R/G, no JVD Extremities:-Edema,-tenderness Neuro: AAO x4, CNII-XII grossly intact Skin: Intact, no rashes or bruising Psych: Normal mood, normal affect  Data Reviewed:  Imaging: CTA 07/27/19 - Centrilobular emphysema. No pulmonary emboli. No infiltrate, effusion or edema CT A/P 10/03/19 - Lower lung fields with centrilobular emphysema. No infiltrate effusion or edema.  PFT: 08/21/20 FVC 2.26 (92%) FEV1 1.14 (60%) Ratio 48  TLC 128% RV 189% RV/TLC 147% DLCO 54% Interpretation: Moderate obstructive defect with air trapping and reduced DLCO consistent with emphysema  Labs: CBC    Component Value Date/Time   WBC 8.0 10/26/2019 1251   RBC 4.20 10/26/2019 1251   HGB 13.3 10/26/2019 1251   HCT 41.5 10/26/2019 1251   PLT 299 10/26/2019 1251   MCV 98.8 10/26/2019 1251   MCH 31.7 10/26/2019 1251   MCHC 32.0 10/26/2019 1251   RDW 12.7 10/26/2019 1251   BMET    Component Value Date/Time   NA 140 10/26/2019 1251   K 4.6 10/26/2019 1251   CL 106 10/26/2019 1251   CO2 27 10/26/2019 1251   GLUCOSE 108 (H) 10/26/2019 1251   BUN 24 (H) 10/26/2019 1251   CREATININE 0.83 10/26/2019 1251   CALCIUM 8.8 (L) 10/26/2019 1251   GFRNONAA >60 10/26/2019 1251   GFRAA >60 10/26/2019 1251   Imaging, labs and test noted above have been reviewed independently by me.   Assessment & Plan:    Discussion: 80 year old female active smoker with atrial fibrillation and hx glaucoma who presents for follow-up. We reviewed PFTs and addressed questions regarding her diagnosis. This is consistent  with her prior CT findings of emphysema. We discussed inhaler instructions, technique and importance of compliance to therapy for maximum benefit. Also discussed monitoring for signs of side effects related to medication regarding her prior glaucoma history. If we run into any issues with LAMA inhalers, she would still benefit from ICS/LABA therapy.  Centrilobular emphysema/COPD --CONTINUE Stioloto TWO puffs ONCE a day --START Xopenex TWO puffs prior to activity or as needed for shortness of breath/wheezing  History of glaucoma Well controlled on current meds --Please discuss with ophthalmologist regarding starting Stiolto. If exam stable, I would recommend continuing your inhaler. We can discuss alternative inhalers (ICS/LABA) if your eye exam worsens  Tobacco abuse Patient is an active smoker. We discussed smoking cessation for <3 minutes. We discussed triggers and stressors and ways to deal with them. We discussed barriers to continued smoking and benefits of smoking cessation.   Health Maintenance Immunization History  Administered Date(s) Administered  . Influenza, High  Dose Seasonal PF 01/24/2018  . PFIZER(Purple Top)SARS-COV-2 Vaccination 06/22/2019   CT Lung Screen - not a candidate due to age. Last CT in 2021, no nodules  No orders of the defined types were placed in this encounter.  Meds ordered this encounter  Medications  . levalbuterol (XOPENEX HFA) 45 MCG/ACT inhaler    Sig: 2 puffs prior to activity or as needed for sob/wheezing    Dispense:  1 each    Refill:  12  . Tiotropium Bromide-Olodaterol 2.5-2.5 MCG/ACT AERS    Sig: Inhale 2 puffs into the lungs daily.    Dispense:  1 each    Refill:  6    Return in about 3 months (around 11/21/2020).  I have spent a total time of 31-minutes on the day of the appointment reviewing prior documentation, coordinating care and discussing medical diagnosis and plan with the patient/family. Imaging, labs and tests included in  this note have been reviewed and interpreted independently by me.  Aasha Dina Mechele Collin, MD [facility] 08/21/2020 11:22 AM  Office Number [phone]

## 2020-08-21 NOTE — Progress Notes (Signed)
Complete PFT preformed today

## 2020-08-21 NOTE — Patient Instructions (Addendum)
Centrilobular emphysema/COPD --CONTINUE Stioloto TWO puffs ONCE a day --START Xopenex TWO puffs prior to activity or as needed for shortness of breath/wheezing  History of glaucoma Well controlled on current meds --Please discuss with ophthalmologist regarding starting Stiolto. If exam stable, I would recommend continuing your inhaler. We can discuss alternative inhalers (ICS/LABA) if your eye exam worsens  Tobacco abuse Patient is an active smoker. We discussed smoking cessation for <3 minutes. We discussed triggers and stressors and ways to deal with them. We discussed barriers to continued smoking and benefits of smoking cessation.   Follow-up with me in 3 months (end of August)

## 2020-08-27 ENCOUNTER — Telehealth: Payer: Self-pay | Admitting: Pulmonary Disease

## 2020-08-27 NOTE — Telephone Encounter (Signed)
Patient states she cannot get her Xopenex HFA filled and that the pharmacy needs more info.  Called pharmacy, verified that pt needs PA. Pt has COPD and diagnosis of Afib with abnormal EKG on 08/21/20 visit.   Initiated PA via [email or web address]/ Key: [id number] PA has been sent to plan, and a determination is expected in 3-7 days.   Will keep message in triage for follow-up.

## 2020-09-01 ENCOUNTER — Other Ambulatory Visit: Payer: Self-pay

## 2020-09-01 ENCOUNTER — Encounter: Payer: Self-pay | Admitting: Pulmonary Disease

## 2020-09-01 DIAGNOSIS — I480 Paroxysmal atrial fibrillation: Secondary | ICD-10-CM

## 2020-09-01 DIAGNOSIS — J449 Chronic obstructive pulmonary disease, unspecified: Secondary | ICD-10-CM | POA: Insufficient documentation

## 2020-09-01 DIAGNOSIS — E782 Mixed hyperlipidemia: Secondary | ICD-10-CM

## 2020-09-01 DIAGNOSIS — J432 Centrilobular emphysema: Secondary | ICD-10-CM | POA: Insufficient documentation

## 2020-09-01 NOTE — Telephone Encounter (Signed)
pt made aware meds has  been approved . Will check Pharm & will call if have any issues.

## 2020-09-01 NOTE — Telephone Encounter (Signed)
lmtcb for pt.   Checked CMM and Xopenex has been approved until 04/03/2021.

## 2020-10-21 ENCOUNTER — Other Ambulatory Visit: Payer: Self-pay | Admitting: Cardiology

## 2020-10-21 DIAGNOSIS — I10 Essential (primary) hypertension: Secondary | ICD-10-CM

## 2020-10-28 ENCOUNTER — Ambulatory Visit: Payer: Medicare PPO | Admitting: Cardiology

## 2020-10-28 ENCOUNTER — Encounter: Payer: Self-pay | Admitting: Cardiology

## 2020-10-28 ENCOUNTER — Other Ambulatory Visit: Payer: Self-pay

## 2020-10-28 VITALS — BP 124/66 | HR 62 | Temp 98.6°F | Resp 16 | Ht 63.0 in | Wt 145.0 lb

## 2020-10-28 DIAGNOSIS — I2510 Atherosclerotic heart disease of native coronary artery without angina pectoris: Secondary | ICD-10-CM

## 2020-10-28 DIAGNOSIS — I480 Paroxysmal atrial fibrillation: Secondary | ICD-10-CM

## 2020-10-28 DIAGNOSIS — R0600 Dyspnea, unspecified: Secondary | ICD-10-CM

## 2020-10-28 DIAGNOSIS — I10 Essential (primary) hypertension: Secondary | ICD-10-CM

## 2020-10-28 DIAGNOSIS — R0609 Other forms of dyspnea: Secondary | ICD-10-CM

## 2020-10-28 NOTE — Progress Notes (Signed)
Patient referred by [name], * for atrial fibrilaltion  Subjective:   Jill Jackson, female    DOB: 1940-12-31, 80 y.o.   MRN: [id number]   Chief Complaint  Patient presents with   Paroxysmal atrial fibrillation    Follow-up   HFpEF    HPI  79 y.o. Caucasian female with hypertension, paroxysmal atrial fibrillation, nonobstructive CAD, COPD  Overall, patient is doing well.  She still has unchanged exertional dyspnea.  She was recently prescribed Xopenex inhaler to be used before activity, by pulmonologist Dr. Loanne Drilling.  It does not appear that patient has started using this.  She denies any chest pain.  She has not had any recent palpitation episodes.  She is compliant with medical therapy.  Current Outpatient Medications on File Prior to Visit  Medication Sig Dispense Refill   acetaminophen (TYLENOL) 500 MG tablet Take 500 mg by mouth 2 (two) times daily as needed for mild pain or headache.      atorvastatin (LIPITOR) 10 MG tablet TAKE 1 TABLET(10 MG) BY MOUTH DAILY 90 tablet 0   buPROPion (WELLBUTRIN SR) 150 MG 12 hr tablet Initial: 150 mg once daily for 3 days; increase to 150 mg twice daily 90 tablet 3   diltiazem (CARDIZEM CD) 180 MG 24 hr capsule Take 1 capsule (180 mg total) by mouth daily. 90 capsule 3   diphenhydrAMINE-APAP, sleep, (TYLENOL PM EXTRA STRENGTH PO) Take 0.5 tablets by mouth at bedtime as needed (Sleep).      ELIQUIS 2.5 MG TABS tablet TAKE 1 TABLET(2.5 MG) BY MOUTH TWICE DAILY 60 tablet 2   HYDROcodone-acetaminophen (NORCO/VICODIN) 5-325 MG tablet Take 1 tablet by mouth every 4 (four) hours as needed for moderate pain.      latanoprost (XALATAN) 0.005 % ophthalmic solution Place 1 drop into both eyes at bedtime.     levalbuterol (XOPENEX HFA) 45 MCG/ACT inhaler 2 puffs prior to activity or as needed for sob/wheezing 1 each 12   levothyroxine (SYNTHROID) 50 MCG tablet Take 1 tablet by mouth daily.     Loratadine (CLARITIN PO) Take by  mouth.     melatonin 1 MG TABS tablet Take 3-5 mg by mouth at bedtime as needed (for sleep).     nitroGLYCERIN (NITROSTAT) 0.4 MG SL tablet Place 1 tablet (0.4 mg total) under the tongue every 5 (five) minutes as needed for up to 25 days for chest pain. 25 tablet 3   pantoprazole (PROTONIX) 40 MG tablet Take 40 mg by mouth daily before breakfast.      psyllium (METAMUCIL) 58.6 % packet Take 1 packet by mouth as needed.     spironolactone (ALDACTONE) 25 MG tablet TAKE 1 TABLET(25 MG) BY MOUTH DAILY 30 tablet 3   Tiotropium Bromide-Olodaterol 2.5-2.5 MCG/ACT AERS Inhale 2 puffs into the lungs daily. 1 each 6   No current facility-administered medications on file prior to visit.    Cardiovascular studies:  EKG 06/19/2020: Sinus rhythm 74 bpm Possible old anteroseptal infarct   Event monitor 09/17/2019 - 09/30/2019: Diagnostic time: 99%  Dominant rhythm: Sinus. HR 44-136 bpm. Avg HR 63 bpm. Episodes of sustained Afib with RVR up to 136 bpm Afib burden 10% No atrial flutter/SVT/VT/high grade AV block, sinus pause >3sec noted. Manually triggered episodes were accidental push, correlated with Afib.  Lexiscan/modified Bruce Tetrofosmin stress test 08/26/2019: Lexiscan/modified Bruce nuclear stress test performed using 1-day protocol. Stress EKG is non-diagnostic, as this is pharmacological stress test. In addition, stress EKG at 79% [facility]  showed sinus tachycardia, <1 mm ST depression in inferolateral leads.  Normal myocardial perfusion. Stress LVEF 62%. Low risk study.   Echocardiogram 08/13/2019:  Normal LV systolic function with visual EF 55-60%. Left ventricle cavity  is normal in size. Mild left ventricular hypertrophy. Normal global wall  motion. Normal diastolic filling pattern, normal LAP.  Mild calcification of the mitral valve annulus. Mild mitral valve leaflet  thickening. Mildly restricted mitral valve leaflets. Moderate (Grade II)  mitral regurgitation.  Mild tricuspid  regurgitation.  Mild pulmonary hypertension. RVSP measures 37 mmHg.  Mild pulmonic regurgitation.  IVC is dilated with respiratory variation.  Compared to prior study 11/23/2018 MR is now moderate and pulmonary  hypertension is new.   Carotid artery duplex  08/13/2019:  Minimal stenosis in the right internal carotid artery (1-15%).  Stenosis in the left internal carotid artery (16-49%).  Antegrade right vertebral artery flow. Antegrade left vertebral artery  flow.  Follow up in one year is appropriate if clinically indicated.  CTA Chest 07/27/2019: 1. No evidence of acute abnormality. No evidence of pulmonary emboli. 2. Coronary artery disease. LAD and RCA coronary acalcifications 3. Aortic Atherosclerosis (ICD10-I70.0) and Emphysema (ICD10-J43.9).     Recent labs: 07/08/2020: Glucose 99, BUN/Cr 20/0.97. EGFR 59. Na/K 136/4.9.   10/26/2019: Glucose 108, BUN/Cr 24/0.8. EGFR >60. Na/K 140/4.6. Rest of the CMP normal H/H 13/41. MCV 98. Platelets 299  11/23/2018: Glucose 93. BUN/Cr 15/0.78. eGFT 73. Na/K 140/4.8.  Chol 154, TG 106, HDL 70, LDL 63.  10/18/2018: Glucose 80. BUN/Cr 16/0.72. eGFR 80/92. Na/K 142/4.3. Rest of the CMP normal. H/H 13/40. MCV 96. TSH 5.5 High   Review of Systems  Cardiovascular:  Positive for dyspnea on exertion. Negative for chest pain, leg swelling, palpitations and syncope.       Vitals:   10/28/20 1135  BP: 124/66  Pulse: 62  Resp: 16  Temp: 98.6 F (37 C)  SpO2: 97%     Body mass index is 25.69 kg/m. Filed Weights   10/28/20 1135  Weight: 145 lb (65.8 kg)     Objective:   Physical Exam Vitals and nursing note reviewed.  Constitutional:      General: She is not in acute distress. Neck:     Vascular: No JVD.  Cardiovascular:     Rate and Rhythm: Normal rate and regular rhythm.     Heart sounds: Normal heart sounds. No murmur heard. Pulmonary:     Effort: Pulmonary effort is normal.     Breath sounds: Normal breath sounds.  No wheezing or rales.  Musculoskeletal:     Right lower leg: No edema.       Assessment & Recommendations:   80 y.o. Caucasian female with hypertension, paroxysmal atrial fibrillation, nonobstructive CAD, COPD  Exertional dyspnea: Likely combination of COPD and HFpEF. HFpEF component is well controlled with medications, including spironolactone. I encouraged her to use Xopenex inhaler before physical activity, as recommended by her pulmonologist Dr. Loanne Drilling.  PAF: Maintaining sinus rhythm.  No recurrent paroxysmal A. fib episodes recently. H/o Amiodarone induced hypothyroidism.   Tolerating diltiazem 180 mg daily. Should she have recurrent A. fib in future, her only option for antiarrhythmic management is Tikosyn.  She is not keen on hospital admission for Tikosyn, or ablation at this time. CHA2DS2VAsc score 4, annual stroke risk 5%. Continue eliquis to 2.5 mg bid. Avoid NSAIDS.  CAD without angina: While she does have LAD and RCA calcification and very likely has multivessel coronary artery disease, her stress test  does not show any ischemia.   Hypertension: Controlled  Labs in 3 months F/u in 6 months  Avier Jech Esther Hardy, MD [facility] Pager: [phone] Office: [phone] If no answer Cell [phone]

## 2020-11-26 ENCOUNTER — Other Ambulatory Visit: Payer: Self-pay | Admitting: Cardiology

## 2020-11-26 DIAGNOSIS — E782 Mixed hyperlipidemia: Secondary | ICD-10-CM

## 2020-12-06 ENCOUNTER — Telehealth: Payer: Self-pay | Admitting: Cardiology

## 2020-12-06 DIAGNOSIS — I480 Paroxysmal atrial fibrillation: Secondary | ICD-10-CM

## 2020-12-06 NOTE — Telephone Encounter (Signed)
ON-CALL CARDIOLOGY 12/06/20  Patient's name: Jill Jackson.   MRN: [id number].    DOB: [birth year] Primary care provider: Kaleen Mask, MD. Primary cardiologist: Dr. Truett Mainland  Interaction regarding this patient's care today: Patient called regarding her heart rate being elevated. Patient states that she was about to go discharged and checked her vital signs and her blood pressure was 120/63 with a pulse of 121 bpm.  She takes her diltiazem every night.  Prior to receiving a phone call she had rechecked her vitals and her blood pressure was 134/73 mmHg and a pulse of 101 bpm.  She denies any chest pain, shortness of breath, near-syncope or syncopal event.  I have asked her to take her evening dose of diltiazem by 3-4pm.   And to call us back if her symptoms continue.  Impression:   ICD-10-CM   1. Paroxysmal atrial fibrillation (HCC)  I48.0       No orders of the defined types were placed in this encounter.   No orders of the defined types were placed in this encounter.   Recommendations: I have asked her to take her evening dose of diltiazem by 3-4pm.   And to call us back if her symptoms continue.  Telephone encounter total time: 13 minutes   Delilah Shan [facility]  Pager: [phone] Office: [phone]

## 2020-12-11 ENCOUNTER — Other Ambulatory Visit: Payer: Self-pay

## 2020-12-11 ENCOUNTER — Telehealth: Payer: Self-pay | Admitting: Cardiology

## 2020-12-11 DIAGNOSIS — I480 Paroxysmal atrial fibrillation: Secondary | ICD-10-CM

## 2020-12-11 MED ORDER — APIXABAN 2.5 MG PO TABS: 2.5000 mg | ORAL_TABLET | Freq: Two times a day (BID) | ORAL | 3 refills | Status: DC

## 2020-12-11 NOTE — Telephone Encounter (Signed)
Pt was called by front desk and will make an appt

## 2020-12-15 ENCOUNTER — Ambulatory Visit: Payer: Medicare PPO | Admitting: Pulmonary Disease

## 2020-12-15 ENCOUNTER — Other Ambulatory Visit: Payer: Self-pay

## 2020-12-15 ENCOUNTER — Encounter: Payer: Self-pay | Admitting: Pulmonary Disease

## 2020-12-15 VITALS — BP 124/78 | HR 67 | Temp 98.3°F | Ht 65.0 in | Wt 144.2 lb

## 2020-12-15 DIAGNOSIS — J432 Centrilobular emphysema: Secondary | ICD-10-CM | POA: Diagnosis not present

## 2020-12-15 DIAGNOSIS — Z716 Tobacco abuse counseling: Secondary | ICD-10-CM | POA: Diagnosis not present

## 2020-12-15 DIAGNOSIS — Z5181 Encounter for therapeutic drug level monitoring: Secondary | ICD-10-CM | POA: Diagnosis not present

## 2020-12-15 DIAGNOSIS — Z720 Tobacco use: Secondary | ICD-10-CM

## 2020-12-15 LAB — HEPATIC FUNCTION PANEL
ALT: 16 U/L (ref 0–35)
AST: 20 U/L (ref 0–37)
Albumin: 4.3 g/dL (ref 3.5–5.2)
Alkaline Phosphatase: 89 U/L (ref 39–117)
Bilirubin, Direct: 0.1 mg/dL (ref 0.0–0.3)
Total Bilirubin: 0.4 mg/dL (ref 0.2–1.2)
Total Protein: 7.5 g/dL (ref 6.0–8.3)

## 2020-12-15 MED ORDER — TIOTROPIUM BROMIDE-OLODATEROL 2.5-2.5 MCG/ACT IN AERS: 2.0000 | INHALATION_SPRAY | Freq: Every day | RESPIRATORY_TRACT | 5 refills | Status: DC

## 2020-12-15 NOTE — Patient Instructions (Addendum)
Centrilobular emphysema/COPD --CONTINUE Stiolto TWO puffs ONCE a day --CONTINUE Xopenex TWO puffs prior to activity or as needed for shortness of breath/wheezing  Anxiety/Smoking cessation --START wellbutrin. Active prescription present. Please call for refills when needed  Tobacco abuse Patient is an active smoker. We discussed smoking cessation for 3 minutes. We discussed triggers and stressors and ways to deal with them. We discussed barriers to continued smoking and benefits of smoking cessation. Provided patient with information cessation techniques and interventions including [place] quitline.  Follow-up with me in 6 months

## 2020-12-15 NOTE — Progress Notes (Signed)
Subjective:   PATIENT ID: Jill Jackson GENDER: female DOB: 1940/08/24, MRN: [id number]   Chief Complaint  Patient presents with   Follow-up    COPD    Reason for Visit: Follow-up  Jill Jackson is a 80 year old female with atrial fibrillation who presents for follow-up  Synopsis: Initially seen for consult on 12/2019. For the last 6-8 months she has severe dyspnea on exertion. Has occasional wheezing. Denies coughing. Before she would feel fatigued but now she has to stop and rest multiple times including when retrieving her mail. She has had to alternate showering every other day and finds herself sitting on her bed frequently to rest between activities. She feels weak and helpless at times. Her dyspnea is associated with palpitations but not every time. She reports recent sensation of her heart racing in the last few days and is planning to discuss with her cardiologist.   08/21/20 Previously took one puff daily with no significant change in her symptoms. Realized she should should be taking taking two puffs daily and recently started that. No visual changes. Plans to see ophthalmologist in August. Continues to smoke daily  12/15/20 Overall she feels she is doing well. She reports shortness of breath with weeding, bending or carrying heavier things. Denies wheezing or chronic coughing. Symptoms seem to be stable. Compliant with Stiolto. Does not use rescue inhaler. Denies recent exacerbations requiring steroid and antibiotics. She continues to smoke socially. Does not buy them and only smokes with other people. No exacerbations requiring antibiotics or steroids in 12 months.  Social History: Active smoker. Socially smokes  Past Medical History:  Diagnosis Date   Atrial fibrillation (HCC) 11/01/2018   HLD (hyperlipidemia) 11/01/2018   Hypertension      Outpatient Medications Prior to Visit  Medication Sig Dispense Refill   acetaminophen (TYLENOL) 500 MG tablet Take  500 mg by mouth 2 (two) times daily as needed for mild pain or headache.      apixaban (ELIQUIS) 2.5 MG TABS tablet Take 1 tablet (2.5 mg total) by mouth 2 (two) times daily. 180 tablet 3   atorvastatin (LIPITOR) 10 MG tablet TAKE 1 TABLET(10 MG) BY MOUTH DAILY 90 tablet 0   buPROPion (WELLBUTRIN SR) 150 MG 12 hr tablet Initial: 150 mg once daily for 3 days; increase to 150 mg twice daily 90 tablet 3   diltiazem (CARDIZEM CD) 180 MG 24 hr capsule Take 1 capsule (180 mg total) by mouth daily. 90 capsule 3   diphenhydrAMINE-APAP, sleep, (TYLENOL PM EXTRA STRENGTH PO) Take 0.5 tablets by mouth at bedtime as needed (Sleep).      HYDROcodone-acetaminophen (NORCO/VICODIN) 5-325 MG tablet Take 1 tablet by mouth every 4 (four) hours as needed for moderate pain.      latanoprost (XALATAN) 0.005 % ophthalmic solution Place 1 drop into both eyes at bedtime.     levalbuterol (XOPENEX HFA) 45 MCG/ACT inhaler 2 puffs prior to activity or as needed for sob/wheezing 1 each 12   levothyroxine (SYNTHROID) 50 MCG tablet Take 1 tablet by mouth daily.     Loratadine (CLARITIN PO) Take by mouth.     melatonin 1 MG TABS tablet Take 3-5 mg by mouth at bedtime as needed (for sleep).     pantoprazole (PROTONIX) 40 MG tablet Take 40 mg by mouth daily before breakfast.      psyllium (METAMUCIL) 58.6 % packet Take 1 packet by mouth as needed.     spironolactone (ALDACTONE) 25 MG tablet  TAKE 1 TABLET(25 MG) BY MOUTH DAILY 30 tablet 3   Tiotropium Bromide-Olodaterol 2.5-2.5 MCG/ACT AERS Inhale 2 puffs into the lungs daily. 1 each 6   nitroGLYCERIN (NITROSTAT) 0.4 MG SL tablet Place 1 tablet (0.4 mg total) under the tongue every 5 (five) minutes as needed for up to 25 days for chest pain. 25 tablet 3   No facility-administered medications prior to visit.    Review of Systems  Constitutional:  Negative for chills, diaphoresis, fever, malaise/fatigue and weight loss.  HENT:  Negative for congestion.   Respiratory:  Positive  for shortness of breath. Negative for cough, hemoptysis, sputum production and wheezing.   Cardiovascular:  Negative for chest pain, palpitations and leg swelling.    Objective:   Vitals:   12/15/20 1108  BP: 124/78  Pulse: 67  Temp: 98.3 F (36.8 C)  TempSrc: Oral  SpO2: 94%  Weight: 144 lb 3.2 oz (65.4 kg)  Height: 5\' 5"  (1.651 m)   SpO2: 94 % O2 Device: None (Room air)  Physical Exam: General: Elderly, well-appearing, no acute distress HENT: [place], AT Eyes: EOMI, no scleral icterus Respiratory: Clear to auscultation bilaterally.  No crackles, wheezing or rales Cardiovascular: irregular rate and rhythm, -M/R/G, no JVD Extremities:-Edema,-tenderness Neuro: AAO x4, CNII-XII grossly intact Psych: Normal mood, normal affect  Data Reviewed:  Imaging: CTA 07/27/19 - Centrilobular emphysema. No pulmonary emboli. No infiltrate, effusion or edema CT A/P 10/03/19 - Lower lung fields with centrilobular emphysema. No infiltrate effusion or edema.  PFT: 08/21/20 FVC 2.26 (92%) FEV1 1.14 (60%) Ratio 48  TLC 128% RV 189% RV/TLC 147% DLCO 54% Interpretation: Moderate obstructive defect with air trapping and reduced DLCO consistent with emphysema  Assessment & Plan:    Discussion: 80 year old female active smoker with atrial fibrillation and history of glaucoma who presents for follow-up. Compliant with her LAMA/LABA. Well-controlled with no exacerbations in >12 months. Discussed clinical course and management of her COPD including bronchodilators and smoking cessation. She is tolerating her Stiolto with no adverse effects, no changes/worsening of visual symptoms.  Centrilobular emphysema/COPD --CONTINUE Stiolto TWO puffs ONCE a day --CONTINUE Xopenex TWO puffs prior to activity or as needed for shortness of breath/wheezing  Anxiety/Smoking cessation --START wellbutrin. Active prescription present. Please call for refills when needed  Tobacco abuse Patient is an active smoker.  Starting wellbutrin as above We discussed smoking cessation for 3 minutes. We discussed triggers and stressors and ways to deal with them. We discussed barriers to continued smoking and benefits of smoking cessation. Provided patient with information cessation techniques and interventions including [place] quitline.  Health Maintenance Immunization History  Administered Date(s) Administered   Influenza, High Dose Seasonal PF 01/24/2018   PFIZER(Purple Top)SARS-COV-2 Vaccination 06/22/2019   CT Lung Screen - not a candidate due to age. Last CT in 2021, no nodules  Orders Placed This Encounter  Procedures   Hepatic function panel    Standing Status:   Future    Number of Occurrences:   1    Standing Expiration Date:   12/15/2021   Meds ordered this encounter  Medications   Tiotropium Bromide-Olodaterol 2.5-2.5 MCG/ACT AERS    Sig: Inhale 2 puffs into the lungs daily.    Dispense:  4 each    Refill:  5    Return in about 6 months (around 06/14/2021).  I have spent a total time of 32-minutes on the day of the appointment reviewing prior documentation, coordinating care and discussing medical diagnosis and plan with the  patient/family. Past medical history, allergies, medications were reviewed. Pertinent imaging, labs and tests included in this note have been reviewed and interpreted independently by me.  Jill Nanez Mechele Collin, MD [facility] 12/15/2020 11:36 AM  Office Number [phone]

## 2020-12-29 ENCOUNTER — Encounter: Payer: Self-pay | Admitting: Pulmonary Disease

## 2020-12-31 ENCOUNTER — Telehealth: Payer: Self-pay | Admitting: Pulmonary Disease

## 2021-01-01 NOTE — Telephone Encounter (Signed)
OK to start wellbutrin

## 2021-01-01 NOTE — Telephone Encounter (Signed)
Normal liver function. Please call patient. Does not have access to mychart.

## 2021-01-01 NOTE — Telephone Encounter (Signed)
JE please advise on lab work. Thanks :)

## 2021-01-01 NOTE — Telephone Encounter (Signed)
Called and spoke with pt and she is aware of lab results.  She wanted to see if JE was ok with her starting back on the bupropion since her labs were normal.  JE please advise. thanks

## 2021-01-01 NOTE — Telephone Encounter (Signed)
Called patient. She is aware that she can restart the Wellbutrin.   Nothing further needed at time of call.

## 2021-01-07 NOTE — Telephone Encounter (Signed)
complete

## 2021-01-18 ENCOUNTER — Other Ambulatory Visit: Payer: Self-pay | Admitting: Cardiology

## 2021-01-18 DIAGNOSIS — I480 Paroxysmal atrial fibrillation: Secondary | ICD-10-CM

## 2021-02-18 ENCOUNTER — Other Ambulatory Visit: Payer: Self-pay | Admitting: Cardiology

## 2021-02-18 DIAGNOSIS — E782 Mixed hyperlipidemia: Secondary | ICD-10-CM

## 2021-02-18 DIAGNOSIS — I10 Essential (primary) hypertension: Secondary | ICD-10-CM

## 2021-04-30 ENCOUNTER — Ambulatory Visit: Payer: Medicare PPO | Admitting: Cardiology

## 2021-04-30 ENCOUNTER — Other Ambulatory Visit: Payer: Self-pay

## 2021-04-30 ENCOUNTER — Encounter: Payer: Self-pay | Admitting: Cardiology

## 2021-04-30 VITALS — BP 148/63 | HR 64 | Temp 97.8°F | Resp 16 | Ht 65.0 in | Wt 146.0 lb

## 2021-04-30 DIAGNOSIS — R0609 Other forms of dyspnea: Secondary | ICD-10-CM

## 2021-04-30 DIAGNOSIS — I480 Paroxysmal atrial fibrillation: Secondary | ICD-10-CM

## 2021-04-30 DIAGNOSIS — I10 Essential (primary) hypertension: Secondary | ICD-10-CM

## 2021-04-30 MED ORDER — APIXABAN 5 MG PO TABS: 5.0000 mg | ORAL_TABLET | Freq: Two times a day (BID) | ORAL | 3 refills | Status: DC

## 2021-04-30 MED ORDER — SPIRONOLACTONE 25 MG PO TABS: 25.0000 mg | ORAL_TABLET | Freq: Every day | ORAL | 3 refills | Status: DC

## 2021-04-30 NOTE — Progress Notes (Signed)
° ° °Patient referred by Elkins, Wilson Oliver, * for atrial fibrilaltion ° °Subjective:  ° °Jill Jackson, female    DOB: 08/11/1940, 80 y.o.   MRN: [id number] ° ° °Chief Complaint  °Patient presents with  ° Atrial Fibrillation  ° Follow-up  °  6 month  ° ° °HPI ° °80 y.o. Caucasian female with hypertension, paroxysmal atrial fibrillation, nonobstructive CAD, COPD ° °Patient is doing well.  She does feel heart racing when she exerts, but does not have any significant chest pain or shortness of breath symptoms.  She has been "eating too much", and has had weight gain, now weighs 66 kg.  She has an upcoming follow-up with her PCP for annual physical and labs. ° ° °Current Outpatient Medications on File Prior to Visit  °Medication Sig Dispense Refill  ° acetaminophen (TYLENOL) 500 MG tablet Take 500 mg by mouth 2 (two) times daily as needed for mild pain or headache.     ° apixaban (ELIQUIS) 2.5 MG TABS tablet Take 1 tablet (2.5 mg total) by mouth 2 (two) times daily. 180 tablet 3  ° atorvastatin (LIPITOR) 10 MG tablet TAKE 1 TABLET(10 MG) BY MOUTH DAILY 90 tablet 0  ° buPROPion (WELLBUTRIN SR) 150 MG 12 hr tablet Initial: 150 mg once daily for 3 days; increase to 150 mg twice daily 90 tablet 3  ° diltiazem (CARDIZEM CD) 180 MG 24 hr capsule TAKE 1 CAPSULE(180 MG) BY MOUTH DAILY 90 capsule 3  ° diphenhydrAMINE-APAP, sleep, (TYLENOL PM EXTRA STRENGTH PO) Take 0.5 tablets by mouth at bedtime as needed (Sleep).     ° HYDROcodone-acetaminophen (NORCO/VICODIN) 5-325 MG tablet Take 1 tablet by mouth every 4 (four) hours as needed for moderate pain.     ° latanoprost (XALATAN) 0.005 % ophthalmic solution Place 1 drop into both eyes at bedtime.    ° levalbuterol (XOPENEX HFA) 45 MCG/ACT inhaler 2 puffs prior to activity or as needed for sob/wheezing 1 each 12  ° levothyroxine (SYNTHROID) 50 MCG tablet Take 1 tablet by mouth daily.    ° Loratadine (CLARITIN PO) Take by mouth.    ° melatonin 1 MG TABS tablet Take 3-5  mg by mouth at bedtime as needed (for sleep).    ° nitroGLYCERIN (NITROSTAT) 0.4 MG SL tablet Place 1 tablet (0.4 mg total) under the tongue every 5 (five) minutes as needed for up to 25 days for chest pain. 25 tablet 3  ° pantoprazole (PROTONIX) 40 MG tablet Take 40 mg by mouth daily before breakfast.     ° psyllium (METAMUCIL) 58.6 % packet Take 1 packet by mouth as needed.    ° spironolactone (ALDACTONE) 25 MG tablet TAKE 1 TABLET(25 MG) BY MOUTH DAILY 90 tablet 0  ° Tiotropium Bromide-Olodaterol 2.5-2.5 MCG/ACT AERS Inhale 2 puffs into the lungs daily. 4 each 5  ° °No current facility-administered medications on file prior to visit.  ° ° °Cardiovascular studies: ° °EKG 04/30/2021: °Sinus rhythm 60 bpm °Low voltage in limb leads °Nonspeciic T wave abnormality ° °EKG 06/19/2020: °Sinus rhythm 74 bpm °Possible old anteroseptal infarct ° ° °Event monitor 09/17/2019 - 09/30/2019: °Diagnostic time: 99%  °Dominant rhythm: Sinus. °HR 44-136 bpm. Avg HR 63 bpm. °Episodes of sustained Afib with RVR up to 136 bpm °Afib burden 10% °No atrial flutter/SVT/VT/high grade AV block, sinus pause >3sec noted. °Manually triggered episodes were accidental push, correlated with Afib. ° °Lexiscan/modified Bruce Tetrofosmin stress test 08/26/2019: °Lexiscan/modified Bruce nuclear stress test performed using 1-day protocol. Stress EKG   EKG is non-diagnostic, as this is pharmacological stress test. In addition, stress EKG at 79% MPHR showed sinus tachycardia, <1 mm ST depression in inferolateral leads.  Normal myocardial perfusion. Stress LVEF 62%. Low risk study.   Echocardiogram 08/13/2019:  Normal LV systolic function with visual EF 55-60%. Left ventricle cavity  is normal in size. Mild left ventricular hypertrophy. Normal global wall  motion. Normal diastolic filling pattern, normal LAP.  Mild calcification of the mitral valve annulus. Mild mitral valve leaflet  thickening. Mildly restricted mitral valve leaflets. Moderate (Grade II)   mitral regurgitation.  Mild tricuspid regurgitation.  Mild pulmonary hypertension. RVSP measures 37 mmHg.  Mild pulmonic regurgitation.  IVC is dilated with respiratory variation.  Compared to prior study 11/23/2018 MR is now moderate and pulmonary  hypertension is new.   Carotid artery duplex  08/13/2019:  Minimal stenosis in the right internal carotid artery (1-15%).  Stenosis in the left internal carotid artery (16-49%).  Antegrade right vertebral artery flow. Antegrade left vertebral artery  flow.  Follow up in one year is appropriate if clinically indicated.  CTA Chest 07/27/2019: 1. No evidence of acute abnormality. No evidence of pulmonary emboli. 2. Coronary artery disease. LAD and RCA coronary acalcifications 3. Aortic Atherosclerosis (ICD10-I70.0) and Emphysema (ICD10-J43.9).     Recent labs: 07/08/2020: Glucose 99, BUN/Cr 20/0.97. EGFR 59. Na/K 136/4.9.   10/26/2019: Glucose 108, BUN/Cr 24/0.8. EGFR >60. Na/K 140/4.6. Rest of the CMP normal H/H 13/41. MCV 98. Platelets 299  11/23/2018: Glucose 93. BUN/Cr 15/0.78. eGFT 73. Na/K 140/4.8.  Chol 154, TG 106, HDL 70, LDL 63.  10/18/2018: Glucose 80. BUN/Cr 16/0.72. eGFR 80/92. Na/K 142/4.3. Rest of the CMP normal. H/H 13/40. MCV 96. TSH 5.5 High   Review of Systems  Cardiovascular:  Positive for dyspnea on exertion. Negative for chest pain, leg swelling, palpitations and syncope.       Vitals:   04/30/21 1044  BP: (!) 148/63  Pulse: 64  Resp: 16  Temp: 97.8 F (36.6 C)  SpO2: 96%     Body mass index is 24.3 kg/m. Filed Weights   04/30/21 1044  Weight: 146 lb (66.2 kg)     Objective:   Physical Exam Vitals and nursing note reviewed.  Constitutional:      General: She is not in acute distress. Neck:     Vascular: No JVD.  Cardiovascular:     Rate and Rhythm: Normal rate and regular rhythm.     Heart sounds: Normal heart sounds. No murmur heard. Pulmonary:     Effort: Pulmonary effort is  normal.     Breath sounds: Normal breath sounds. No wheezing or rales.  Musculoskeletal:     Right lower leg: No edema.       Assessment & Recommendations:   81 y.o. Caucasian female with hypertension, paroxysmal atrial fibrillation, nonobstructive CAD, COPD  Exertional dyspnea: Likely combination of COPD and HFpEF. Well-controlled on medical management of both. Refilled spironolactone.  PAF: Maintaining sinus rhythm.  No recurrent paroxysmal A. fib episodes recently. H/o Amiodarone induced hypothyroidism.   Tolerating diltiazem 180 mg daily. Should she have recurrent A. fib in future, her only option for antiarrhythmic management is Tikosyn.  She is not keen on hospital admission for Tikosyn, or ablation at this time. CHA2DS2VAsc score 4, annual stroke risk 5%. Given that her rate is now 66 mg, renal function has been historically normal, I have increased her Eliquis to 5 mg daily.  She will get labs checked with her PCP  in the near future, will need to check BMP.    CAD without angina: While she does have LAD and RCA calcification and very likely has multivessel coronary artery disease, her stress test does not show any ischemia.  Check lipid panel with PCP.  Hypertension: Blood pressure elevated today, usually well controlled.  No changes made today.  Has follow-up with PCP in the next month.    F/u in 6 months  Geza Beranek Esther Hardy, MD [facility] Pager: [phone] Office: [phone] If no answer Cell [phone]

## 2021-06-01 ENCOUNTER — Telehealth: Payer: Self-pay

## 2021-06-01 NOTE — Telephone Encounter (Signed)
Error

## 2021-06-03 ENCOUNTER — Telehealth: Payer: Self-pay

## 2021-06-03 ENCOUNTER — Other Ambulatory Visit: Payer: Self-pay | Admitting: Cardiology

## 2021-06-03 DIAGNOSIS — I480 Paroxysmal atrial fibrillation: Secondary | ICD-10-CM

## 2021-06-03 DIAGNOSIS — E782 Mixed hyperlipidemia: Secondary | ICD-10-CM

## 2021-06-03 DIAGNOSIS — I10 Essential (primary) hypertension: Secondary | ICD-10-CM

## 2021-06-03 MED ORDER — APIXABAN 5 MG PO TABS: 5.0000 mg | ORAL_TABLET | Freq: Two times a day (BID) | ORAL | 3 refills | Status: DC

## 2021-06-03 NOTE — Telephone Encounter (Addendum)
Reviewed PCP labs. Refilled Eliquis 5 mg bid.  ? ?Thanks ?MJP ? ?

## 2021-06-03 NOTE — Telephone Encounter (Signed)
Pt requesting medication refill but she stated that you had wanted blood work done first? Please advise.  ?

## 2021-06-03 NOTE — Addendum Note (Signed)
Addended by: Elder Negus on: 06/03/2021 04:00 PM ? ? Modules accepted: Orders ? ?

## 2021-06-03 NOTE — Telephone Encounter (Signed)
Pt requesting spironolactone and atorvastatin refills as well. She stated that you also wanted her to wait for these medications as well? Please advise.

## 2021-06-04 MED ORDER — SPIRONOLACTONE 25 MG PO TABS: 25.0000 mg | ORAL_TABLET | Freq: Every day | ORAL | 3 refills | Status: DC

## 2021-06-04 MED ORDER — ATORVASTATIN CALCIUM 10 MG PO TABS: 10.0000 mg | ORAL_TABLET | Freq: Every day | ORAL | 3 refills | Status: DC

## 2021-06-04 NOTE — Addendum Note (Signed)
Addended by: Elder Negus on: 06/04/2021 12:36 PM ? ? Modules accepted: Orders ? ?

## 2021-06-04 NOTE — Telephone Encounter (Signed)
Reviewed labs. K 5.1, which is acceptable. Cholesterol well controlled. Refills sent.0 ?Please let the patient know. ? ?Thanks ?MJP

## 2021-06-04 NOTE — Telephone Encounter (Signed)
Called and spoke to pt, pt voiced understanding and will pick up her medication.

## 2021-06-30 ENCOUNTER — Other Ambulatory Visit: Payer: Self-pay

## 2021-06-30 ENCOUNTER — Ambulatory Visit: Payer: Medicare PPO | Admitting: Pulmonary Disease

## 2021-06-30 ENCOUNTER — Encounter: Payer: Self-pay | Admitting: Pulmonary Disease

## 2021-06-30 VITALS — BP 130/68 | HR 68 | Ht 65.0 in | Wt 145.6 lb

## 2021-06-30 DIAGNOSIS — J432 Centrilobular emphysema: Secondary | ICD-10-CM | POA: Diagnosis not present

## 2021-06-30 DIAGNOSIS — Z720 Tobacco use: Secondary | ICD-10-CM | POA: Diagnosis not present

## 2021-06-30 MED ORDER — TIOTROPIUM BROMIDE-OLODATEROL 2.5-2.5 MCG/ACT IN AERS: 2.0000 | INHALATION_SPRAY | Freq: Every day | RESPIRATORY_TRACT | 5 refills | Status: DC

## 2021-06-30 NOTE — Progress Notes (Signed)
? ? ?Subjective:  ? ?PATIENT ID: Jill Jackson, Jill Jackson ? ? ?Chief Complaint  ?Patient presents with  ? Follow-up  ?  sob  ? ? ?Reason for Visit: Follow-up ? ?[name] is a 81 year old female with atrial fibrillation who presents for follow-up ? ?Synopsis: Initially seen for consult on 12/2019. For the last 6-8 months she has severe dyspnea on exertion. Has occasional wheezing. Denies coughing. Before she would feel fatigued but now she has to stop and rest multiple times including when retrieving her mail. She has had to alternate showering every other day and finds herself sitting on her bed frequently to rest between activities. She feels weak and helpless at times. Her dyspnea is associated with palpitations but not every time. She reports recent sensation of her heart racing in the last few days and is planning to discuss with her cardiologist.  ? ?08/21/20 ?Previously took one puff daily with no significant change in her symptoms. Realized she should should be taking taking two puffs daily and recently started that. No visual changes. Plans to see ophthalmologist in August. Continues to smoke daily ? ?12/15/20 ?Overall she feels she is doing well. She reports shortness of breath with weeding, bending or carrying heavier things. Denies wheezing or chronic coughing. Symptoms seem to be stable. Compliant with Stiolto. Does not use rescue inhaler. Denies recent exacerbations requiring steroid and antibiotics. She continues to smoke socially. Does not buy them and only smokes with other people. No exacerbations requiring antibiotics or steroids in 12 months. ? ?06/30/21 ?She reports overall doing well. Compliant with Stiolto daily. Never/rarely uses rescue inhaler. Denies shortness of breath, cough and wheezing. She rakes and other yardwork. She only smokes socially. Working on cutting down still. Wants to tell me next time she hasn't smoke for a few months.  Denies any exacerbations or ED visit since our last visit. ? ?Social History: ?Active smoker. Socially smokes ? ?Past Medical History:  ?Diagnosis Date  ? Atrial fibrillation ([place]) 11/01/2018  ? HLD (hyperlipidemia) 11/01/2018  ? Hypertension   ?  ? ?Outpatient Medications Prior to Visit  ?Medication Sig Dispense Refill  ? apixaban (ELIQUIS) 5 MG TABS tablet Take 1 tablet (5 mg total) by mouth 2 (two) times daily. 180 tablet 3  ? atorvastatin (LIPITOR) 10 MG tablet Take 1 tablet (10 mg total) by mouth daily. 90 tablet 3  ? diltiazem (CARDIZEM CD) 180 MG 24 hr capsule TAKE 1 CAPSULE(180 MG) BY MOUTH DAILY 90 capsule 3  ? HYDROcodone-acetaminophen (NORCO/VICODIN) 5-325 MG tablet Take 1 tablet by mouth every 4 (four) hours as needed for moderate pain.     ? latanoprost (XALATAN) 0.005 % ophthalmic solution Place 1 drop into both eyes at bedtime.    ? levothyroxine (SYNTHROID) 50 MCG tablet Take 1 tablet by mouth daily.    ? melatonin 1 MG TABS tablet Take 3-5 mg by mouth at bedtime as needed (for sleep).    ? pantoprazole (PROTONIX) 40 MG tablet Take 40 mg by mouth daily before breakfast.     ? psyllium (METAMUCIL) 58.6 % packet Take 1 packet by mouth as needed.    ? spironolactone (ALDACTONE) 25 MG tablet Take 1 tablet (25 mg total) by mouth daily. 90 tablet 3  ? diphenhydrAMINE-APAP, sleep, (TYLENOL PM EXTRA STRENGTH PO) Take 0.5 tablets by mouth at bedtime as needed (Sleep).  (Patient not taking: Reported on 06/30/2021)    ? levalbuterol (XOPENEX HFA) 45 MCG/ACT  inhaler 2 puffs prior to activity or as needed for sob/wheezing (Patient not taking: Reported on 06/30/2021) 1 each 12  ? Loratadine (CLARITIN PO) Take by mouth. (Patient not taking: Reported on 06/30/2021)    ? nitroGLYCERIN (NITROSTAT) 0.4 MG SL tablet Place 1 tablet (0.4 mg total) under the tongue every 5 (five) minutes as needed for up to 25 days for chest pain. 25 tablet 3  ? acetaminophen (TYLENOL) 500 MG tablet Take 500 mg by mouth 2 (two) times daily as  needed for mild pain or headache.  (Patient not taking: Reported on 06/30/2021)    ? buPROPion (WELLBUTRIN SR) 150 MG 12 hr tablet Initial: 150 mg once daily for 3 days; increase to 150 mg twice daily (Patient not taking: Reported on 06/30/2021) 90 tablet 3  ? Tiotropium Bromide-Olodaterol 2.5-2.5 MCG/ACT AERS Inhale 2 puffs into the lungs daily. (Patient not taking: Reported on 06/30/2021) 4 each 5  ? ?No facility-administered medications prior to visit.  ? ? ?Review of Systems  ?Constitutional:  Negative for chills, diaphoresis, fever, malaise/fatigue and weight loss.  ?HENT:  Negative for congestion.   ?Respiratory:  Negative for cough, hemoptysis, sputum production, shortness of breath and wheezing.   ?Cardiovascular:  Negative for chest pain, palpitations and leg swelling.  ? ? ?Objective:  ? ?Vitals:  ? 06/30/21 1024  ?BP: 130/68  ?Pulse: 68  ?SpO2: 96%  ?Weight: 145 lb 9.6 oz (66 kg)  ?Height: 5\' 5"  (1.651 m)  ? ?SpO2: 96 % ?O2 Device: None (Room air) ? ?Physical Exam: ?General: Elderly-appearing, no acute distress ?HENT: Jill Jackson, AT ?Eyes: EOMI, no scleral icterus ?Respiratory: Clear to auscultation bilaterally.  No crackles, wheezing or rales ?Cardiovascular: Regular rate and rhythm, -M/R/G, no JVD ?Extremities:-Edema,-tenderness ?Neuro: AAO x4, CNII-XII grossly intact ?Psych: Normal mood, normal affect ? ?Data Reviewed: ? ?Imaging: ?CTA 07/27/19 - Centrilobular emphysema. No pulmonary emboli. No infiltrate, effusion or edema ?CT A/P 10/03/19 - Lower lung fields with centrilobular emphysema. No infiltrate effusion or edema. ? ?PFT: ?08/21/20 ?FVC 2.26 (92%) FEV1 1.14 (60%) Ratio 48  TLC 128% RV 189% RV/TLC 147% DLCO 54% ?Interpretation: Moderate obstructive defect with air trapping and reduced DLCO consistent with emphysema ? ?Assessment & Plan:  ? ? ?Discussion: ?81 year old female active smoker with COPD, atrial fibrillation, hx glaucoma who presents for follow-up. Well-controlled on LAMA/LABA with no exacerbations  in >18 months. Discussed clinical course and management of COPD including bronchodilator regimen and action plan for exacerbation. She tolerating her Stiolto with changes in vision. ? ?Centrilobular emphysema/COPD ?--CONTINUE Stiolto TWO puffs ONCE a day. REFILLED ?--CONTINUE Xopenex TWO puffs prior to activity or as needed for shortness of breath/wheezing ? ?Anxiety/Smoking cessation ?--Self-discontinued wellbutrin ? ?Tobacco abuse ?Patient is an active smoker. Social smoker ?We discussed smoking cessation for 3 minutes. We discussed triggers and stressors and ways to deal with them. We discussed barriers to continued smoking and benefits of smoking cessation. Provided patient with information cessation techniques and interventions including [facility] quitline. ? ? ?Health Maintenance ?Immunization History  ?Administered Date(s) Administered  ? Influenza, High Dose Seasonal PF 01/24/2018  ? PFIZER(Purple Top)SARS-COV-2 Vaccination 06/22/2019  ? ?CT Lung Screen - not a candidate due to age. Last CT in 2021, no nodules ? ?No orders of the defined types were placed in this encounter. ? ?Meds ordered this encounter  ?Medications  ? Tiotropium Bromide-Olodaterol 2.5-2.5 MCG/ACT AERS  ?  Sig: Inhale 2 puffs into the lungs daily.  ?  Dispense:  4 each  ?  Refill:  5  ? ? ?Return in about 6 months (around 12/31/2021). ? ?I have spent a total time of 32-minutes on the day of the appointment reviewing prior documentation, coordinating care and discussing medical diagnosis and plan with the patient/family. Past medical history, allergies, medications were reviewed. Pertinent imaging, labs and tests included in this note have been reviewed and interpreted independently by me. ? ?Jasmine Mcbeth Rodman Pickle, MD ?[facility] ?06/30/2021 11:13 AM  ?Office Number [phone] ? ? ?

## 2021-06-30 NOTE — Patient Instructions (Signed)
Centrilobular emphysema/COPD ?--CONTINUE Stiolto TWO puffs ONCE a day. REFILLED ?--CONTINUE Xopenex TWO puffs prior to activity or as needed for shortness of breath/wheezing ? ?Follow-up with me in 6 months ?

## 2021-10-24 ENCOUNTER — Other Ambulatory Visit: Payer: Self-pay | Admitting: Pulmonary Disease

## 2021-10-28 ENCOUNTER — Ambulatory Visit: Payer: Medicare PPO | Admitting: Cardiology

## 2021-11-07 NOTE — Progress Notes (Signed)
Patient referred by Jill Jackson, * for atrial fibrilaltion  Subjective:   Jill Jackson, female    DOB: 1940/08/05, 81 y.o.   MRN: [id number]   Chief Complaint  Patient presents with   Atrial Fibrillation   Follow-up    HPI  81 y.o. Caucasian female with hypertension, paroxysmal atrial fibrillation, nonobstructive CAD, COPD  Patient is doing well.  She denies any cardiac complaints.  Complaint time, she has pain in her right shoulder and back with certain movements, unrelated to exertion.    Reviewed recent lab results from February 2023.    Current Outpatient Medications:    apixaban (ELIQUIS) 5 MG TABS tablet, Take 1 tablet (5 mg total) by mouth 2 (two) times daily., Disp: 180 tablet, Rfl: 3   atorvastatin (LIPITOR) 10 MG tablet, Take 1 tablet (10 mg total) by mouth daily., Disp: 90 tablet, Rfl: 3   diltiazem (CARDIZEM CD) 180 MG 24 hr capsule, TAKE 1 CAPSULE(180 MG) BY MOUTH DAILY, Disp: 90 capsule, Rfl: 3   diphenhydrAMINE-APAP, sleep, (TYLENOL PM EXTRA STRENGTH PO), Take 0.5 tablets by mouth at bedtime as needed (Sleep).  (Patient not taking: Reported on 06/30/2021), Disp: , Rfl:    HYDROcodone-acetaminophen (NORCO/VICODIN) 5-325 MG tablet, Take 1 tablet by mouth every 4 (four) hours as needed for moderate pain. , Disp: , Rfl:    latanoprost (XALATAN) 0.005 % ophthalmic solution, Place 1 drop into both eyes at bedtime., Disp: , Rfl:    levalbuterol (XOPENEX HFA) 45 MCG/ACT inhaler, INHALE 2 PUFFS BY MOUTH BEFORE ACTIVITY AS NEEDED FOR SHORTNESS OF BREATH OR WHEEZING, Disp: 15 g, Rfl: 2   levothyroxine (SYNTHROID) 50 MCG tablet, Take 1 tablet by mouth daily., Disp: , Rfl:    Loratadine (CLARITIN PO), Take by mouth. (Patient not taking: Reported on 06/30/2021), Disp: , Rfl:    melatonin 1 MG TABS tablet, Take 3-5 mg by mouth at bedtime as needed (for sleep)., Disp: , Rfl:    nitroGLYCERIN (NITROSTAT) 0.4 MG SL tablet, Place 1 tablet (0.4 mg total) under the  tongue every 5 (five) minutes as needed for up to 25 days for chest pain., Disp: 25 tablet, Rfl: 3   pantoprazole (PROTONIX) 40 MG tablet, Take 40 mg by mouth daily before breakfast. , Disp: , Rfl:    psyllium (METAMUCIL) 58.6 % packet, Take 1 packet by mouth as needed., Disp: , Rfl:    spironolactone (ALDACTONE) 25 MG tablet, Take 1 tablet (25 mg total) by mouth daily., Disp: 90 tablet, Rfl: 3   Tiotropium Bromide-Olodaterol 2.5-2.5 MCG/ACT AERS, Inhale 2 puffs into the lungs daily., Disp: 4 each, Rfl: 5  Cardiovascular studies:  EKG 11/08/2021: Sinus rhythm 68 bpm  Low voltage in limb leads Poor R wave progression  Event monitor 09/17/2019 - 09/30/2019: Diagnostic time: 99%  Dominant rhythm: Sinus. HR 44-136 bpm. Avg HR 63 bpm. Episodes of sustained Afib with RVR up to 136 bpm Afib burden 10% No atrial flutter/SVT/VT/high grade AV block, sinus pause >3sec noted. Manually triggered episodes were accidental push, correlated with Afib.  Lexiscan/modified Bruce Tetrofosmin stress test 08/26/2019: Lexiscan/modified Bruce nuclear stress test performed using 1-day protocol. Stress EKG is non-diagnostic, as this is pharmacological stress test. In addition, stress EKG at 79% MPHR showed sinus tachycardia, <1 mm ST depression in inferolateral leads.  Normal myocardial perfusion. Stress LVEF 62%. Low risk study.   Echocardiogram 08/13/2019:  Normal LV systolic function with visual EF 55-60%. Left ventricle cavity  is normal in size. Mild  left ventricular hypertrophy. Normal global wall  motion. Normal diastolic filling pattern, normal LAP.  Mild calcification of the mitral valve annulus. Mild mitral valve leaflet  thickening. Mildly restricted mitral valve leaflets. Moderate (Grade II)  mitral regurgitation.  Mild tricuspid regurgitation.  Mild pulmonary hypertension. RVSP measures 37 mmHg.  Mild pulmonic regurgitation.  IVC is dilated with respiratory variation.  Compared to prior study  11/23/2018 MR is now moderate and pulmonary  hypertension is new.   Carotid artery duplex  08/13/2019:  Minimal stenosis in the right internal carotid artery (1-15%).  Stenosis in the left internal carotid artery (16-49%).  Antegrade right vertebral artery flow. Antegrade left vertebral artery  flow.  Follow up in one year is appropriate if clinically indicated.  CTA Chest 07/27/2019: 1. No evidence of acute abnormality. No evidence of pulmonary emboli. 2. Coronary artery disease. LAD and RCA coronary acalcifications 3. Aortic Atherosclerosis (ICD10-I70.0) and Emphysema (ICD10-J43.9).     Recent labs: 05/25/2021: Glucose 98, BUN/Cr 17/0.93. EGFR 62. Na/K 137/5.1. Rest of the CMP normal H/H 13/39. MCV 96. Platelets 343 HbA1C NA Chol 131, TG 105, HDL 56, LDL 56 TSH 1.4 normal  07/08/2020: Glucose 99, BUN/Cr 20/0.97. EGFR 59. Na/K 136/4.9.   Review of Systems  Cardiovascular:  Positive for dyspnea on exertion. Negative for chest pain, leg swelling, palpitations and syncope.        Vitals:   11/08/21 1037  BP: 124/63  Pulse: 71  Resp: 16  Temp: (!) 97.4 F (36.3 C)  SpO2: 96%     Body mass index is 23.6 kg/m. Filed Weights   11/08/21 1037  Weight: 141 lb 12.8 oz (64.3 kg)     Objective:   Physical Exam Vitals and nursing note reviewed.  Constitutional:      General: She is not in acute distress. Neck:     Vascular: No JVD.  Cardiovascular:     Rate and Rhythm: Normal rate and regular rhythm.     Heart sounds: Murmur heard.     High-pitched blowing holosystolic murmur is present with a grade of 3/6 at the apex.  Pulmonary:     Effort: Pulmonary effort is normal.     Breath sounds: Normal breath sounds. No wheezing or rales.  Musculoskeletal:     Right lower leg: No edema.        Assessment & Recommendations:   81 y.o. Caucasian female with hypertension, paroxysmal atrial fibrillation, nonobstructive CAD, COPD  PAF: Maintaining sinus rhythm.  No  recurrent paroxysmal A. fib episodes recently. H/o Amiodarone induced hypothyroidism.   Tolerating diltiazem 180 mg daily. CHA2DS2VAsc score 4, annual stroke risk 5%. Conitnue Eliquis 5 mg bid  Mitral regurgitation: Moderate (grade II) in 2021. Repeat echocardiogram.  CAD without angina: While she does have LAD and RCA calcification and very likely has multivessel coronary artery disease, her stress test does not show any ischemia (08/2019)  Lipids very well controlled.  Hypertension: Well controlled. Refilled spironolactone, which has also helped with leg edema.   F/u in 1 year  Nigel Mormon, MD [facility] Pager: [phone] Office: [phone] If no answer Cell [phone]

## 2021-11-08 ENCOUNTER — Ambulatory Visit: Payer: Medicare PPO | Admitting: Cardiology

## 2021-11-08 ENCOUNTER — Encounter: Payer: Self-pay | Admitting: Cardiology

## 2021-11-08 VITALS — BP 124/63 | HR 71 | Temp 97.4°F | Resp 16 | Ht 65.0 in | Wt 141.8 lb

## 2021-11-08 DIAGNOSIS — I480 Paroxysmal atrial fibrillation: Secondary | ICD-10-CM

## 2021-11-08 DIAGNOSIS — I340 Nonrheumatic mitral (valve) insufficiency: Secondary | ICD-10-CM

## 2021-11-08 DIAGNOSIS — I10 Essential (primary) hypertension: Secondary | ICD-10-CM

## 2021-11-08 DIAGNOSIS — E782 Mixed hyperlipidemia: Secondary | ICD-10-CM

## 2021-11-08 MED ORDER — APIXABAN 5 MG PO TABS
5.0000 mg | ORAL_TABLET | Freq: Two times a day (BID) | ORAL | 3 refills | Status: DC
Start: 2021-11-08 — End: 2022-12-07

## 2021-11-08 MED ORDER — SPIRONOLACTONE 25 MG PO TABS: 25.0000 mg | ORAL_TABLET | Freq: Every day | ORAL | 3 refills | Status: DC

## 2021-11-08 MED ORDER — DILTIAZEM HCL ER BEADS 180 MG PO CP24: 180.0000 mg | ORAL_CAPSULE | Freq: Every day | ORAL | 3 refills | Status: DC

## 2021-11-08 MED ORDER — ATORVASTATIN CALCIUM 10 MG PO TABS: 10.0000 mg | ORAL_TABLET | Freq: Every day | ORAL | 3 refills | Status: DC

## 2021-11-09 ENCOUNTER — Telehealth: Payer: Self-pay

## 2021-11-09 NOTE — Telephone Encounter (Signed)
Monitor for now. If no better, EKG visit tomorrow.  Thanks MJP

## 2021-11-09 NOTE — Telephone Encounter (Signed)
Pt called to inform us that she woke up this morning feeling fine and about hour ago she has been feeling tired, sob,headache and really weak. Denies cp, arm pain. Pt mention her pulse has dropped a little. Pt mention she did a home remedy for her pulse to come down. 124/83 pulse 116. Pt mention she takes her diltiazem in the evening.

## 2021-11-12 NOTE — Telephone Encounter (Signed)
Patient came into the office for an appt

## 2021-12-27 ENCOUNTER — Encounter: Payer: Self-pay | Admitting: Pulmonary Disease

## 2021-12-27 ENCOUNTER — Ambulatory Visit: Payer: Medicare PPO | Admitting: Pulmonary Disease

## 2021-12-27 VITALS — BP 114/60 | HR 68 | Ht 65.0 in | Wt 142.2 lb

## 2021-12-27 DIAGNOSIS — J432 Centrilobular emphysema: Secondary | ICD-10-CM | POA: Diagnosis not present

## 2021-12-27 DIAGNOSIS — J029 Acute pharyngitis, unspecified: Secondary | ICD-10-CM

## 2021-12-27 MED ORDER — TIOTROPIUM BROMIDE-OLODATEROL 2.5-2.5 MCG/ACT IN AERS: 2.0000 | INHALATION_SPRAY | Freq: Every day | RESPIRATORY_TRACT | 3 refills | Status: DC

## 2021-12-27 MED ORDER — BUPROPION HCL ER (XL) 150 MG PO TB24: 150.0000 mg | ORAL_TABLET | Freq: Every day | ORAL | 5 refills | Status: DC

## 2021-12-27 MED ORDER — AMOXICILLIN-POT CLAVULANATE 875-125 MG PO TABS: 1.0000 | ORAL_TABLET | Freq: Two times a day (BID) | ORAL | 0 refills | Status: DC

## 2021-12-27 NOTE — Progress Notes (Signed)
Subjective:   PATIENT ID: [name] GENDER: female DOB: October 08, 1940, MRN: [id number]   Chief Complaint  Patient presents with   Follow-up    Sore throat    Reason for Visit: Follow-up  [name] is a 81 year old former smoker with COPD with emphysema, atrial fibrillation, mitral valve regurgitation, HTN, HLD, GERD, hypothyroidism who presents for follow-up.  Synopsis:  Initially seen for consult on 12/2019. For the last 6-8 months she has severe dyspnea on exertion. Has occasional wheezing. Denies coughing. Before she would feel fatigued but now she has to stop and rest multiple times including when retrieving her mail. She has had to alternate showering every other day and finds herself sitting on her bed frequently to rest between activities. She feels weak and helpless at times. Her dyspnea is associated with palpitations but not every time. She reports recent sensation of her heart racing in the last few days and is planning to discuss with her cardiologist.  2022- Stepped up to [facility]. No exacerbations 2023 - Cut down smoking to social  06/30/21 She reports overall doing well. Compliant with Stiolto daily. Never/rarely uses rescue inhaler. Denies shortness of breath, cough and wheezing. She rakes and other yardwork. She only smokes socially. Working on cutting down still. Wants to tell me next time she hasn't smoke for a few months. Denies any exacerbations or ED visit since our last visit.  12/27/21 Since our last visit she denies any exacerbations. Compliant with Stiolto. Does not use rescue inhaler. Able to perform activities at home. Lives with grand daughter and great granddaughter. Socially smokes 1-2 times a month. Reports anxiety has worsened since stopping wellbutrin. She reports 3 day history of malaise sore throat, difficulty swallowing with right sided pain, chills and lymphadenopathy. No fevers checked. Had to take half a a hydrocodan.  Social  History: Active smoker. Socially smokes  Past Medical History:  Diagnosis Date   Atrial fibrillation (HCC) 11/01/2018   HLD (hyperlipidemia) 11/01/2018   Hypertension      Outpatient Medications Prior to Visit  Medication Sig Dispense Refill   apixaban (ELIQUIS) 5 MG TABS tablet Take 1 tablet (5 mg total) by mouth 2 (two) times daily. 180 tablet 3   atorvastatin (LIPITOR) 10 MG tablet Take 1 tablet (10 mg total) by mouth daily. 90 tablet 3   calcium carbonate (TUMS - DOSED IN MG ELEMENTAL CALCIUM) 500 MG chewable tablet Chew 1 tablet by mouth daily.     diltiazem (TIAZAC) 180 MG 24 hr capsule Take 1 capsule (180 mg total) by mouth daily at 12 noon. 90 capsule 3   diphenhydrAMINE-APAP, sleep, (TYLENOL PM EXTRA STRENGTH PO) Take 0.5 tablets by mouth at bedtime as needed (Sleep).     HYDROcodone-acetaminophen (NORCO/VICODIN) 5-325 MG tablet Take 1 tablet by mouth every 4 (four) hours as needed for moderate pain.      levothyroxine (SYNTHROID) 50 MCG tablet Take 1 tablet by mouth daily.     melatonin 1 MG TABS tablet Take 3-5 mg by mouth at bedtime as needed (for sleep).     psyllium (METAMUCIL) 58.6 % packet Take 1 packet by mouth daily.     spironolactone (ALDACTONE) 25 MG tablet Take 1 tablet (25 mg total) by mouth daily. 90 tablet 3   nitroGLYCERIN (NITROSTAT) 0.4 MG SL tablet Place 1 tablet (0.4 mg total) under the tongue every 5 (five) minutes as needed for up to 25 days for chest pain. 25 tablet 3  pantoprazole (PROTONIX) 40 MG tablet Take 40 mg by mouth daily before breakfast.  (Patient not taking: Reported on 12/27/2021)     traZODone (DESYREL) 50 MG tablet Take by mouth. (Patient not taking: Reported on 11/08/2021)     Tiotropium Bromide-Olodaterol 2.5-2.5 MCG/ACT AERS Inhale 2 puffs into the lungs daily. (Patient not taking: Reported on 12/27/2021) 4 each 5   No facility-administered medications prior to visit.    Review of Systems  Constitutional:  Positive for malaise/fatigue.  Negative for chills, diaphoresis, fever and weight loss.  HENT:  Positive for sore throat. Negative for congestion.        Tonsillar pain  Respiratory:  Negative for cough, hemoptysis, sputum production, shortness of breath and wheezing.   Cardiovascular:  Negative for chest pain, palpitations and leg swelling.     Objective:   Vitals:   12/27/21 1024  BP: 114/60  Pulse: 68  SpO2: 98%  Weight: 142 lb 3.2 oz (64.5 kg)  Height: 5\' 5"  (1.651 m)   SpO2: 98 % O2 Device: None (Room air) Physical Exam: General: Well-appearing, no acute distress HENT: [place], AT, erythematous pharynx with R>L tonsillar exudates Eyes: EOMI, no scleral icterus Respiratory: Clear to auscultation bilaterally.  No crackles, wheezing or rales Cardiovascular: RRR, -M/R/G, no JVD Extremities:-Edema,-tenderness Neuro: AAO x4, CNII-XII grossly intact Psych: Normal mood, normal affect  Data Reviewed:  Imaging: CTA 07/27/19 - Centrilobular emphysema. No pulmonary emboli. No infiltrate, effusion or edema CT A/P 10/03/19 - Lower lung fields with centrilobular emphysema. No infiltrate effusion or edema.  PFT: 08/21/20 FVC 2.26 (92%) FEV1 1.14 (60%) Ratio 48  TLC 128% RV 189% RV/TLC 147% DLCO 54% Interpretation: Moderate obstructive defect with air trapping and reduced DLCO consistent with emphysema  Assessment & Plan:    Discussion: 81 year old female active smoker with COPD, atrial fibrillation, hx glaucoma who presents for follow-up. Well-controlled on LAMA/LABA with no exacerbations in >2 years. Discussed clinical course and management of COPD including bronchodilator regimen and action plan for exacerbation. Presented with acute pharyngitis, suspect bacterial. Also counseled on smoking and anxiety, will restart wellbutrin  Viral vs bacterial pharyngitis --ORDERED augmentin. Advised to take if symptoms are worse or persistent for an additional two days  Centrilobular emphysema/COPD --CONTINUE Stiolto TWO puffs  ONCE a day. REFILLED --CONTINUE Xopenex TWO puffs prior to activity or as needed for shortness of breath/wheezing  Anxiety/Smoking cessation --Restart wellbutrin 150 mg daily  Tobacco abuse Patient is an active smoker. Social smoker    Health Maintenance Immunization History  Administered Date(s) Administered   Influenza, High Dose Seasonal PF 01/24/2018   PFIZER(Purple Top)SARS-COV-2 Vaccination 06/22/2019   CT Lung Screen - not a candidate due to age. Last CT in 2021, no nodules  No orders of the defined types were placed in this encounter.  Meds ordered this encounter  Medications   Tiotropium Bromide-Olodaterol 2.5-2.5 MCG/ACT AERS    Sig: Inhale 2 puffs into the lungs daily.    Dispense:  3 each    Refill:  3   buPROPion (WELLBUTRIN XL) 150 MG 24 hr tablet    Sig: Take 1 tablet (150 mg total) by mouth daily.    Dispense:  30 tablet    Refill:  5   amoxicillin-clavulanate (AUGMENTIN) 875-125 MG tablet    Sig: Take 1 tablet by mouth 2 (two) times daily.    Dispense:  14 tablet    Refill:  0    Return in about 6 months (around 06/27/2022).  I have  spent a total time of 33-minutes on the day of the appointment including chart review, data review, collecting history, coordinating care and discussing medical diagnosis and plan with the patient/family. Past medical history, allergies, medications were reviewed. Pertinent imaging, labs and tests included in this note have been reviewed and interpreted independently by me.  Ryin Ambrosius Mechele Collin, MD [facility] 12/27/2021 12:36 PM  Office Number [phone]

## 2021-12-27 NOTE — Patient Instructions (Addendum)
Centrilobular emphysema/COPD --CONTINUE Stiolto TWO puffs ONCE a day. REFILLED --CONTINUE Xopenex TWO puffs prior to activity or as needed for shortness of breath/wheezing  Viral vs bacterial pharyngitis --ORDERED augmentin. Advised to take if symptoms are worse or persistent for an additional two days  Follow-up with me in 6 months

## 2021-12-28 ENCOUNTER — Other Ambulatory Visit: Payer: Medicare PPO

## 2022-01-11 ENCOUNTER — Emergency Department (HOSPITAL_COMMUNITY)
Admission: EM | Admit: 2022-01-11 | Discharge: 2022-01-11 | Disposition: A | Discharge status: Home / Self Care | Payer: Medicare PPO | Attending: Emergency Medicine | Admitting: Emergency Medicine

## 2022-01-11 ENCOUNTER — Encounter (HOSPITAL_COMMUNITY): Payer: Self-pay

## 2022-01-11 ENCOUNTER — Emergency Department (HOSPITAL_COMMUNITY): Payer: Medicare PPO

## 2022-01-11 DIAGNOSIS — R112 Nausea with vomiting, unspecified: Secondary | ICD-10-CM | POA: Diagnosis not present

## 2022-01-11 DIAGNOSIS — I2510 Atherosclerotic heart disease of native coronary artery without angina pectoris: Secondary | ICD-10-CM | POA: Insufficient documentation

## 2022-01-11 DIAGNOSIS — I10 Essential (primary) hypertension: Secondary | ICD-10-CM | POA: Diagnosis not present

## 2022-01-11 DIAGNOSIS — E860 Dehydration: Secondary | ICD-10-CM | POA: Diagnosis not present

## 2022-01-11 DIAGNOSIS — R531 Weakness: Secondary | ICD-10-CM | POA: Diagnosis present

## 2022-01-11 DIAGNOSIS — F17210 Nicotine dependence, cigarettes, uncomplicated: Secondary | ICD-10-CM | POA: Insufficient documentation

## 2022-01-11 DIAGNOSIS — J449 Chronic obstructive pulmonary disease, unspecified: Secondary | ICD-10-CM | POA: Diagnosis not present

## 2022-01-11 DIAGNOSIS — I4891 Unspecified atrial fibrillation: Secondary | ICD-10-CM | POA: Diagnosis not present

## 2022-01-11 DIAGNOSIS — Z7951 Long term (current) use of inhaled steroids: Secondary | ICD-10-CM | POA: Diagnosis not present

## 2022-01-11 DIAGNOSIS — H9201 Otalgia, right ear: Secondary | ICD-10-CM | POA: Diagnosis not present

## 2022-01-11 DIAGNOSIS — Z20822 Contact with and (suspected) exposure to covid-19: Secondary | ICD-10-CM | POA: Insufficient documentation

## 2022-01-11 DIAGNOSIS — Z7901 Long term (current) use of anticoagulants: Secondary | ICD-10-CM | POA: Diagnosis not present

## 2022-01-11 DIAGNOSIS — Z79899 Other long term (current) drug therapy: Secondary | ICD-10-CM | POA: Insufficient documentation

## 2022-01-11 LAB — URINALYSIS, ROUTINE W REFLEX MICROSCOPIC
Bilirubin Urine: NEGATIVE
Glucose, UA: NEGATIVE mg/dL
Hgb urine dipstick: NEGATIVE
Ketones, ur: 20 mg/dL — AB
Nitrite: NEGATIVE
Protein, ur: NEGATIVE mg/dL
Specific Gravity, Urine: 1.024 (ref 1.005–1.030)
pH: 5 (ref 5.0–8.0)

## 2022-01-11 LAB — CBC
HCT: 42 % (ref 36.0–46.0)
Hemoglobin: 14.1 g/dL (ref 12.0–15.0)
MCH: 32.9 pg (ref 26.0–34.0)
MCHC: 33.6 g/dL (ref 30.0–36.0)
MCV: 97.9 fL (ref 80.0–100.0)
Platelets: 411 10*3/uL — ABNORMAL HIGH (ref 150–400)
RBC: 4.29 MIL/uL (ref 3.87–5.11)
RDW: 12.5 % (ref 11.5–15.5)
WBC: 8.5 10*3/uL (ref 4.0–10.5)
nRBC: 0 % (ref 0.0–0.2)

## 2022-01-11 LAB — RESP PANEL BY RT-PCR (FLU A&B, COVID) ARPGX2
Influenza A by PCR: NEGATIVE
Influenza B by PCR: NEGATIVE
SARS Coronavirus 2 by RT PCR: NEGATIVE

## 2022-01-11 LAB — COMPREHENSIVE METABOLIC PANEL
ALT: 16 U/L (ref 0–44)
AST: 20 U/L (ref 15–41)
Albumin: 4 g/dL (ref 3.5–5.0)
Alkaline Phosphatase: 85 U/L (ref 38–126)
Anion gap: 11 (ref 5–15)
BUN: 24 mg/dL — ABNORMAL HIGH (ref 8–23)
CO2: 20 mmol/L — ABNORMAL LOW (ref 22–32)
Calcium: 9.1 mg/dL (ref 8.9–10.3)
Chloride: 104 mmol/L (ref 98–111)
Creatinine, Ser: 1.01 mg/dL — ABNORMAL HIGH (ref 0.44–1.00)
GFR, Estimated: 56 mL/min — ABNORMAL LOW (ref >60)
Glucose, Bld: 81 mg/dL (ref 70–99)
Potassium: 4.6 mmol/L (ref 3.5–5.1)
Sodium: 135 mmol/L (ref 135–145)
Total Bilirubin: 1.3 mg/dL — ABNORMAL HIGH (ref 0.3–1.2)
Total Protein: 7.6 g/dL (ref 6.5–8.1)

## 2022-01-11 MED ORDER — SODIUM CHLORIDE (PF) 0.9 % IJ SOLN
INTRAMUSCULAR | Status: AC
  Filled 2022-01-11: qty 50

## 2022-01-11 MED ORDER — SODIUM CHLORIDE 0.9 % IV BOLUS
1000.0000 mL | Freq: Once | INTRAVENOUS | Status: AC
  Administered 2022-01-11: 1000 mL via INTRAVENOUS

## 2022-01-11 MED ORDER — ONDANSETRON HCL 4 MG/2ML IJ SOLN
4.0000 mg | Freq: Once | INTRAMUSCULAR | Status: AC
  Administered 2022-01-11: 4 mg via INTRAVENOUS
  Filled 2022-01-11: qty 2

## 2022-01-11 MED ORDER — IOHEXOL 300 MG/ML  SOLN
100.0000 mL | Freq: Once | INTRAMUSCULAR | Status: AC | PRN
  Administered 2022-01-11: 100 mL via INTRAVENOUS

## 2022-01-11 NOTE — ED Provider Notes (Signed)
[facility] DEPT Provider Note   CSN: [id number] Arrival date & time: 01/11/22  1532     History  Chief Complaint  Patient presents with   Emesis   Weakness    Jill Jackson is a 81 y.o. female with history of A-fib on Eliquis, hypertension, hyperlipidemia, CAD, COPD and 7.5 smoking pack years who presents the emergency department complaining of sore throat, right ear pain, nausea and vomiting today.  Patient states that she has been feeling poorly for the past 2 to 3 weeks.  She saw her primary doctor and they gave her Augmentin, eardrops, and lidocaine mouthwash.  She states that she finished these and had no improvement in her symptoms.  No fever.  Has had a loss of appetite, with intermittent episodes of emesis and diarrhea.  No abdominal or chest pain.  Episodes of diarrhea are not constant, with no blood seen.    Emesis Associated symptoms: diarrhea and sore throat   Associated symptoms: no abdominal pain, no cough and no fever   Weakness Associated symptoms: diarrhea, nausea and vomiting   Associated symptoms: no abdominal pain, no cough, no dysuria, no fever and no shortness of breath        Home Medications Prior to Admission medications   Medication Sig Start Date End Date Taking? Authorizing Provider  amoxicillin-clavulanate (AUGMENTIN) 875-125 MG tablet Take 1 tablet by mouth 2 (two) times daily. 12/27/21   Margaretha Seeds, MD  apixaban (ELIQUIS) 5 MG TABS tablet Take 1 tablet (5 mg total) by mouth 2 (two) times daily. 11/08/21   Patwardhan, Reynold Bowen, MD  atorvastatin (LIPITOR) 10 MG tablet Take 1 tablet (10 mg total) by mouth daily. 11/08/21   Patwardhan, Reynold Bowen, MD  buPROPion (WELLBUTRIN XL) 150 MG 24 hr tablet Take 1 tablet (150 mg total) by mouth daily. 12/27/21   Margaretha Seeds, MD  calcium carbonate (TUMS - DOSED IN MG ELEMENTAL CALCIUM) 500 MG chewable tablet Chew 1 tablet by mouth daily.    [provider]   diltiazem (TIAZAC) 180 MG 24 hr capsule Take 1 capsule (180 mg total) by mouth daily at 12 noon. 11/08/21   Patwardhan, Manish J, MD  diphenhydrAMINE-APAP, sleep, (TYLENOL PM EXTRA STRENGTH PO) Take 0.5 tablets by mouth at bedtime as needed (Sleep).    [provider]  HYDROcodone-acetaminophen (NORCO/VICODIN) 5-325 MG tablet Take 1 tablet by mouth every 4 (four) hours as needed for moderate pain.  08/05/19   [provider]  levothyroxine (SYNTHROID) 50 MCG tablet Take 1 tablet by mouth daily. 03/03/20   [provider]  melatonin 1 MG TABS tablet Take 3-5 mg by mouth at bedtime as needed (for sleep).    [provider]  nitroGLYCERIN (NITROSTAT) 0.4 MG SL tablet Place 1 tablet (0.4 mg total) under the tongue every 5 (five) minutes as needed for up to 25 days for chest pain. 07/30/19 11/08/21  Adrian Prows, MD  pantoprazole (PROTONIX) 40 MG tablet Take 40 mg by mouth daily before breakfast.  Patient not taking: Reported on 12/27/2021 10/11/19   [provider]  psyllium (METAMUCIL) 58.6 % packet Take 1 packet by mouth daily.    [provider]  spironolactone (ALDACTONE) 25 MG tablet Take 1 tablet (25 mg total) by mouth daily. 11/08/21   Patwardhan, Reynold Bowen, MD  Tiotropium Bromide-Olodaterol 2.5-2.5 MCG/ACT AERS Inhale 2 puffs into the lungs daily. 12/27/21   Margaretha Seeds, MD  traZODone (DESYREL) 50 MG tablet  Take by mouth. Patient not taking: Reported on 11/08/2021 07/06/21   [provider]      Allergies    Tetracyclines & related    Review of Systems   Review of Systems  Constitutional:  Positive for appetite change. Negative for fever.  HENT:  Positive for ear pain and sore throat. Negative for trouble swallowing.   Respiratory:  Negative for cough and shortness of breath.   Gastrointestinal:  Positive for diarrhea, nausea and vomiting. Negative for abdominal pain and blood in stool.  Genitourinary:  Negative for dysuria.   Neurological:  Positive for weakness.  All other systems reviewed and are negative.   Physical Exam Updated Vital Signs BP 129/60   Pulse 77   Temp 98 F (36.7 C)   Resp 12   SpO2 97%  Physical Exam Vitals and nursing note reviewed.  Constitutional:      Appearance: Normal appearance.  HENT:     Head: Normocephalic and atraumatic.     Right Ear: Decreased hearing noted. Tympanic membrane is perforated and erythematous.     Left Ear: Tympanic membrane, ear canal and external ear normal.     Mouth/Throat:     Lips: Pink.     Mouth: Mucous membranes are moist. No oral lesions.     Pharynx: Oropharynx is clear. Uvula midline. No oropharyngeal exudate or posterior oropharyngeal erythema.     Tonsils: No tonsillar exudate or tonsillar abscesses.  Eyes:     Conjunctiva/sclera: Conjunctivae normal.  Cardiovascular:     Rate and Rhythm: Normal rate and regular rhythm.  Pulmonary:     Effort: Pulmonary effort is normal. No respiratory distress.     Breath sounds: Normal breath sounds.  Abdominal:     General: There is no distension.     Palpations: Abdomen is soft.     Tenderness: There is no abdominal tenderness.  Skin:    General: Skin is warm and dry.  Neurological:     General: No focal deficit present.     Mental Status: She is alert.     ED Results / Procedures / Treatments   Labs (all labs ordered are listed, but only abnormal results are displayed) Labs Reviewed  CBC - Abnormal; Notable for the following components:      Result Value   Platelets 411 (*)    All other components within normal limits  URINALYSIS, ROUTINE W REFLEX MICROSCOPIC - Abnormal; Notable for the following components:   APPearance HAZY (*)    Ketones, ur 20 (*)    Leukocytes,Ua SMALL (*)    Bacteria, UA RARE (*)    All other components within normal limits  COMPREHENSIVE METABOLIC PANEL - Abnormal; Notable for the following components:   CO2 20 (*)    BUN 24 (*)    Creatinine, Ser 1.01  (*)    Total Bilirubin 1.3 (*)    GFR, Estimated 56 (*)    All other components within normal limits  RESP PANEL BY RT-PCR (FLU A&B, COVID) [id number]    EKG EKG Interpretation  Date/Time:  Tuesday January 11 2022 16:10:04 EDT Ventricular Rate:  68 PR Interval:  157 QRS Duration: 88 QT Interval:  406 QTC Calculation: 432 R Axis:   -41 Text Interpretation: Sinus rhythm Inferior infarct, old Confirmed by Regan Lemming ([id number]) on 01/11/2022 6:47:55 PM  Radiology CT ABDOMEN PELVIS W CONTRAST  Result Date: 01/11/2022 CLINICAL DATA:  Abdominal pain, nausea/vomiting EXAM: CT ABDOMEN AND PELVIS WITH CONTRAST TECHNIQUE: Multidetector CT  imaging of the abdomen and pelvis was performed using the standard protocol following bolus administration of intravenous contrast. RADIATION DOSE REDUCTION: This exam was performed according to the departmental dose-optimization program which includes automated exposure control, adjustment of the mA and/or kV according to patient size and/or use of iterative reconstruction technique. CONTRAST:  134mL OMNIPAQUE IOHEXOL 300 MG/ML  SOLN COMPARISON:  10/03/2019 FINDINGS: Lower chest: Lung bases are clear. Hepatobiliary: Liver is within normal limits. Gallbladder is unremarkable. No intrahepatic or extrahepatic ductal dilatation. Pancreas: Within normal limits. Spleen: Within normal limits. Adrenals/Urinary Tract: 13 mm right adrenal nodule, measuring 48 and 22 HUs on portal venous and delayed phases (demonstrating greater than 40% relative washout), compatible with a benign adrenal adenoma. This is unchanged from 2021. No follow-up is recommended. Left adrenal gland is within normal limits. Kidneys are within normal limits.  No hydronephrosis. Bladder is within normal limits. Stomach/Bowel: Stomach is within normal limits. No evidence of bowel obstruction. Normal appendix (series 2/image 34). Mild sigmoid diverticulosis, without evidence of diverticulitis. Vascular/Lymphatic:  No evidence of abdominal aortic aneurysm. Atherosclerotic calcifications of the abdominal aorta and branch vessels. No suspicious abdominopelvic lymphadenopathy. Reproductive: Uterus and bilateral ovaries are within normal limits. Other: No abdominopelvic ascites. Musculoskeletal: Mild degenerative changes of the lumbar spine. IMPRESSION: No CT findings to account for the patient's abdominal pain. Mild sigmoid diverticulosis, without evidence of diverticulitis. Electronically Signed   By: Julian Hy M.D.   On: 01/11/2022 18:44    Procedures Procedures    Medications Ordered in ED Medications  sodium chloride 0.9 % bolus 1,000 mL (0 mLs Intravenous Stopped 01/11/22 1844)  sodium chloride (PF) 0.9 % injection (  Given 01/11/22 1953)  iohexol (OMNIPAQUE) 300 MG/ML solution 100 mL (100 mLs Intravenous Contrast Given 01/11/22 1836)  sodium chloride 0.9 % bolus 1,000 mL (0 mLs Intravenous Stopped 01/11/22 2116)  ondansetron (ZOFRAN) injection 4 mg (4 mg Intravenous Given 01/11/22 1957)    ED Course/ Medical Decision Making/ A&P Clinical Course as of 01/11/22 2127  Tue Jan 11, 2022  1705 This is an 81 year old female presented ED with generalized weakness, poor appetite, ongoing for several weeks but worsening the past few days.  She says she is not able to keep any thing down and feels nauseous when she eats.  She has also had intermittent issues with her ears, and has a chronic right ruptured TM.  On exam the patient is thin and frail.  Her vital signs are within normal limits.  She does have a perforated right TM.  No evidence of acute ear infection or mastoid tenderness on exam.  She does some tender cervical lymphadenopathy.  She does not have any abdominal pain or tenderness on exam.  Labs show no leukocytosis.  CMP is largely at baseline, including electrolytes, there is mild uptake in BUN and creatinine.  We can order a liter of fluids.  Patient is pending CT abdomen pelvis to evaluate for  alternative cause of her nausea and poor appetite or intra-abdominal process.  We will also test a COVID swab as well. [MT]    Clinical Course User Index [MT] Trifan, Carola Rhine, MD                           Medical Decision Making Amount and/or Complexity of Data Reviewed Labs: ordered. Radiology: ordered.  Risk Prescription drug management.   This patient is a 81 y.o. female  who presents to the ED for concern  of generalized weakness, sore throat, nausea and vomiting.   Differential diagnoses prior to evaluation: The emergent differential diagnosis includes, but is not limited to,  CVA, ACS, Arrhythmia, syncope, orthostatic hypotension, sepsis, hypoglycemia, electrolyte disturbance, hypothyroidism, respiratory failure, anemia, dehydration, heat injury, polypharmacy, malignancy. This is not an exhaustive differential.   Past Medical History / Co-morbidities: A-fib on Eliquis, hypertension, hyperlipidemia, CAD, COPD and 7.5 smoking pack years  Physical Exam: Physical exam performed. The pertinent findings include: Afebrile, normal vital signs.  In no acute distress.  Lung sounds are clear.  Perforated right tympanic membrane, reports it is chronic.  Abdomen is soft, nontender.  Tender right-sided cervical adenopathy.  Lab Tests/Imaging studies: I personally interpreted labs/imaging and the pertinent results include: CBC unremarkable.  CMP with mildly elevated creatinine, otherwise at baseline.  Urinalysis with small leukocytes, otherwise unremarkable.  Respiratory panel negative for COVID and flu.    CT abdomen pelvis with no acute intra-abdominal findings.. I agree with the radiologist interpretation.  Cardiac monitoring: EKG obtained and interpreted by my attending physician which shows: sinus rhythm   Medications: I ordered medication including IV fluids and zofran.  I have reviewed the patients home medicines and have made adjustments as needed.   Disposition: After  consideration of the diagnostic results and the patients response to treatment, I feel that emergency department workup does not suggest an emergent condition requiring admission or immediate intervention beyond what has been performed at this time. The plan is: discharge to home with symptomatic management of dehydration and generalized weakness.  Strongly encourage follow-up with PCP and GI doctor.  Passed a p.o. challenge while in the department.  The patient is safe for discharge and has been instructed to return immediately for worsening symptoms, change in symptoms or any other concerns.   I discussed this case with my attending physician Dr. Langston Masker who cosigned this note including patient's presenting symptoms, physical exam, and planned diagnostics and interventions. Attending physician stated agreement with plan or made changes to plan which were implemented.    Final Clinical Impression(s) / ED Diagnoses Final diagnoses:  Dehydration  Generalized weakness    Rx / DC Orders ED Discharge Orders     None      Portions of this report may have been transcribed using voice recognition software. Every effort was made to ensure accuracy; however, inadvertent computerized transcription errors may be present.    Estill Cotta 01/11/22 2127    Wyvonnia Dusky, MD 01/12/22 [phone]

## 2022-01-11 NOTE — ED Notes (Signed)
Patient transported to CT 

## 2022-01-11 NOTE — Discharge Instructions (Signed)
You were seen in the emergency department for vomiting and generalized weakness.  As we discussed, your lab work and imaging today was all reassuring. We did not see any significant evidence of infection or blockages. I suspect your symptoms are likely related to a viral infection. I recommend continuing to hydrate orally. I think you would benefit from following up with your primary doctor and your GI specialist.  Continue to monitor how you're doing and return to the ER for new or worsening symptoms such as fevers, persistent vomiting or diarrhea.

## 2022-01-11 NOTE — ED Triage Notes (Signed)
Pt arrived via POV, c/o sore throat, left ear pain, no appetite,  and nausea vomiting today. Feeling overall weak and unwell.

## 2022-01-18 ENCOUNTER — Other Ambulatory Visit: Payer: Self-pay | Admitting: Cardiology

## 2022-01-18 DIAGNOSIS — I480 Paroxysmal atrial fibrillation: Secondary | ICD-10-CM

## 2022-02-08 ENCOUNTER — Ambulatory Visit
Admission: RE | Admit: 2022-02-08 | Discharge: 2022-02-08 | Disposition: A | Discharge status: Home / Self Care | Payer: Medicare PPO | Source: Ambulatory Visit | Attending: Family Medicine | Admitting: Family Medicine

## 2022-02-08 ENCOUNTER — Other Ambulatory Visit: Payer: Self-pay | Admitting: Family Medicine

## 2022-02-08 DIAGNOSIS — R0789 Other chest pain: Secondary | ICD-10-CM

## 2022-02-11 ENCOUNTER — Telehealth: Payer: Self-pay

## 2022-02-11 NOTE — Telephone Encounter (Signed)
Patient agreed to continuing to take diltiazem. She is still concerned that it is the medication causing the sore throat, and said she will call back in about two weeks if it hasn't subsided.

## 2022-02-11 NOTE — Telephone Encounter (Signed)
Very unlikely. She has been on diltiazem for a long time. Continue for now.  Thanks MJP

## 2022-03-16 ENCOUNTER — Ambulatory Visit: Payer: Medicare PPO

## 2022-03-16 DIAGNOSIS — I340 Nonrheumatic mitral (valve) insufficiency: Secondary | ICD-10-CM

## 2022-03-21 NOTE — Progress Notes (Signed)
No answer. LVM ?

## 2022-03-22 NOTE — Progress Notes (Signed)
Pt aware.

## 2022-04-20 ENCOUNTER — Telehealth (HOSPITAL_BASED_OUTPATIENT_CLINIC_OR_DEPARTMENT_OTHER): Payer: Self-pay | Admitting: Pulmonary Disease

## 2022-04-20 NOTE — Telephone Encounter (Signed)
Pt called to sch follow up during encounter pt was made aware of new location. Pt stated this office is too far away for her. Requesting to see a different provider at [facility]. Please advise.

## 2022-04-20 NOTE — Telephone Encounter (Signed)
Dr. Loanne Drilling is it okay for the patient to see a provider here at [facility]? Please advise

## 2022-04-20 NOTE — Telephone Encounter (Signed)
Very understandable. She is a well-controlled COPD patient on LAMA/LABA.   Please schedule for follow-up with next available provider for 30 min in March to establish care.

## 2022-04-21 NOTE — Telephone Encounter (Signed)
March schedules are not open yet.  Routing to front desk pool so they can help out with this once our March schedules for [facility] location are open.   **Pt will need a 32min New Patient appt with any doctor at [facility] location in March 2024**

## 2022-06-28 ENCOUNTER — Ambulatory Visit (HOSPITAL_BASED_OUTPATIENT_CLINIC_OR_DEPARTMENT_OTHER): Payer: Medicare PPO | Admitting: Pulmonary Disease

## 2022-07-12 ENCOUNTER — Ambulatory Visit: Payer: Medicare PPO | Admitting: Internal Medicine

## 2022-07-21 NOTE — Telephone Encounter (Signed)
Pt has an upcoming appt with Dr. Celine Mans. Closing encounter.

## 2022-08-04 ENCOUNTER — Encounter: Payer: Self-pay | Admitting: Internal Medicine

## 2022-08-04 ENCOUNTER — Ambulatory Visit: Payer: Medicare PPO | Admitting: Internal Medicine

## 2022-08-04 VITALS — BP 122/70 | HR 61 | Temp 98.1°F | Ht 65.0 in | Wt 136.0 lb

## 2022-08-04 DIAGNOSIS — J4489 Other specified chronic obstructive pulmonary disease: Secondary | ICD-10-CM | POA: Diagnosis not present

## 2022-08-04 DIAGNOSIS — J439 Emphysema, unspecified: Secondary | ICD-10-CM

## 2022-08-04 NOTE — Patient Instructions (Addendum)
Please schedule follow up scheduled with myself in 6 months.  If my schedule is not open yet, we will contact you with a reminder closer to that time. Please call [phone] if you haven't heard from [place] a month before.   Continue stiolto 2 puffs daily.  If you feel short of breath in between you can take the xopanex inhaler.

## 2022-08-04 NOTE — Progress Notes (Signed)
The patient has been prescribed the inhaler xopanex. Inhaler technique was demonstrated to patient. The patient subsequently demonstrated correct technique. ° °

## 2022-08-04 NOTE — Progress Notes (Signed)
Jill Jackson    [id number]    1940/10/10  Primary Care Physician:Elkins, Curly Rim, MD Date of Appointment: 08/04/2022 Established Patient Visit  Chief complaint:   Chief Complaint  Patient presents with   Consult    Est Care from Dr. Everardo All wanted to remain on [address]     HPI: Jill Jackson is a 82 y.o. woman with history of COPD FEV1 60% of predicted  Interval Updates: Here for follow up. Previous patient of Dr Everardo All but switching to me today to stay at the [address] Office.  Has been on stiolto 2 puffs once daily with prn xopanex. Minimal xopanex use. Breathing is doing ok  Was hospitalized for dehydration due to sore throat. She had an EGD which showed no esophageal issues but moderate gastritis. CT neck negative and barium swallow showed normal esophageal motility. No aspiration.   Saw ENT Dr. Christell Constant and had normal laryngoscopy.   She still has a globus sensation with eating but it is improved and not stopping her from eating and drinking.   Has some chronic back pain and takes hydrocodone some days for this.   Has quit smoking, but has a cigarette on average once/week. Used nicotine patch to quit.   I have reviewed the patient's family social and past medical history and updated as appropriate.   Past Medical History:  Diagnosis Date   Atrial fibrillation (HCC) 11/01/2018   HLD (hyperlipidemia) 11/01/2018   Hypertension     Past Surgical History:  Procedure Laterality Date   TUBAL LIGATION  1977    Family History  Problem Relation Age of Onset   Stroke Mother    Kidney failure Sister    Hypertension Sister    Thyroid disease Sister    Stroke Brother    Hypertension Brother     Social History   Occupational History   Not on file  Tobacco Use   Smoking status: Some Days    Packs/day: 0.25    Years: 30.00    Additional pack years: 0.00    Total pack years: 7.50    Types: Cigarettes    Passive  exposure: Current   Smokeless tobacco: Never   Tobacco comments:    About 1 cigarette per week PAP 08/04/2022  Vaping Use   Vaping Use: Never used  Substance and Sexual Activity   Alcohol use: Not Currently   Drug use: Not Currently   Sexual activity: Not Currently     Physical Exam: Blood pressure 122/70, pulse 61, temperature 98.1 F (36.7 C), temperature source Oral, height 5\' 5"  (1.651 m), weight 136 lb (61.7 kg), SpO2 97 %.  Gen:      No acute distress Lungs:    No increased respiratory effort, symmetric chest wall excursion, clear to auscultation bilaterally, no wheezes or crackles CV:         systolic murmur, regular rate, no edema  Data Reviewed: Imaging: I have personally reviewed the chest xray Nov 2023 - no acute pulmonary process  PFTs:     Latest Ref Rng & Units 08/21/2020   10:04 AM  PFT Results  FVC-Pre L 2.11   FVC-Predicted Pre % 85   FVC-Post L 2.26   FVC-Predicted Post % 92   Pre FEV1/FVC % % 48   Post FEV1/FCV % % 50   FEV1-Pre L 1.01   FEV1-Predicted Pre % 53   FEV1-Post L 1.14   DLCO uncorrected ml/min/mmHg 12.57  DLCO UNC% % 54   DLCO corrected ml/min/mmHg 12.57   DLCO COR %Predicted % 54   DLVA Predicted % 63   TLC L 6.32   TLC % Predicted % 128   RV % Predicted % 189    I have personally reviewed the patient's PFTs and moderate airflow limitation with air trapping and reduced diffusion capacity.   Labs: Lab Results  Component Value Date   NA 135 01/11/2022   K 4.6 01/11/2022   CO2 20 (L) 01/11/2022   GLUCOSE 81 01/11/2022   BUN 24 (H) 01/11/2022   CREATININE 1.01 (H) 01/11/2022   CALCIUM 9.1 01/11/2022   GFRNONAA 56 (L) 01/11/2022   Lab Results  Component Value Date   WBC 8.5 01/11/2022   HGB 14.1 01/11/2022   HCT 42.0 01/11/2022   MCV 97.9 01/11/2022   PLT 411 (H) 01/11/2022    Immunization status: Immunization History  Administered Date(s) Administered   Influenza, High Dose Seasonal PF 01/24/2018   PFIZER(Purple  Top)SARS-COV-2 Vaccination 06/22/2019    External Records Personally Reviewed: pulmonary  Assessment:  COPD, moderate with FEV1 60% of predicted, well controlled  Plan/Recommendations: Continue stiolto 2 puffs daily.  If you feel short of breath in between you can take the xopanex inhaler.   I spent 30 minutes on 08/04/2022 in care of this patient including face to face time and non-face to face time spent charting, review of outside records, and coordination of care.  Return to Care: Return in about 6 months (around 02/04/2023).   Durel Salts, MD Pulmonary and Critical Care Medicine [facility] [phone]

## 2022-11-09 ENCOUNTER — Ambulatory Visit: Payer: Medicare PPO | Admitting: Cardiology

## 2022-11-18 ENCOUNTER — Other Ambulatory Visit: Payer: Self-pay | Admitting: Cardiology

## 2022-11-18 DIAGNOSIS — I10 Essential (primary) hypertension: Secondary | ICD-10-CM

## 2022-11-18 DIAGNOSIS — E782 Mixed hyperlipidemia: Secondary | ICD-10-CM

## 2022-12-07 ENCOUNTER — Encounter: Payer: Self-pay | Admitting: Cardiology

## 2022-12-07 ENCOUNTER — Ambulatory Visit: Payer: Medicare PPO | Admitting: Cardiology

## 2022-12-07 VITALS — BP 125/64 | HR 73 | Resp 16 | Ht 65.0 in | Wt 129.0 lb

## 2022-12-07 DIAGNOSIS — I480 Paroxysmal atrial fibrillation: Secondary | ICD-10-CM

## 2022-12-07 DIAGNOSIS — I10 Essential (primary) hypertension: Secondary | ICD-10-CM

## 2022-12-07 MED ORDER — SPIRONOLACTONE 25 MG PO TABS: 25.0000 mg | ORAL_TABLET | Freq: Every day | ORAL | 3 refills | Status: AC

## 2022-12-07 MED ORDER — DILTIAZEM HCL ER BEADS 180 MG PO CP24: 180.0000 mg | ORAL_CAPSULE | Freq: Every day | ORAL | 3 refills | Status: DC

## 2022-12-07 MED ORDER — APIXABAN 5 MG PO TABS: 5.0000 mg | ORAL_TABLET | Freq: Two times a day (BID) | ORAL | 3 refills | Status: DC

## 2022-12-07 MED ORDER — DILTIAZEM HCL 30 MG PO TABS: 30.0000 mg | ORAL_TABLET | Freq: Three times a day (TID) | ORAL | 2 refills | Status: AC | PRN

## 2022-12-07 NOTE — Progress Notes (Signed)
Patient referred by Kaleen Mask, * for atrial fibrilaltion  Subjective:   Jill Jackson, female    DOB: 1940/07/22, 82 y.o.   MRN: [id number]   Chief Complaint  Patient presents with   Atrial Fibrillation   Follow-up    1 year    HPI  82 y.o. Caucasian female with hypertension, paroxysmal atrial fibrillation, nonobstructive CAD, COPD  Over the past 1 year, patient has had about 3 episodes of rapid heartbeat, only occasionally >100 bpm.  This usually occurs with stress.  She otherwise denies any severe chest pain, shortness of breath, palpitation symptoms.  She has been dealing with sore throat for the past year, which has been attributed to GERD.  She has been taking pantoprazole with some improvement.   Current Outpatient Medications:    apixaban (ELIQUIS) 5 MG TABS tablet, Take 1 tablet (5 mg total) by mouth 2 (two) times daily., Disp: 180 tablet, Rfl: 3   atorvastatin (LIPITOR) 10 MG tablet, TAKE 1 TABLET(10 MG) BY MOUTH DAILY, Disp: 90 tablet, Rfl: 3   buPROPion (WELLBUTRIN XL) 150 MG 24 hr tablet, Take 1 tablet (150 mg total) by mouth daily., Disp: 30 tablet, Rfl: 5   calcium carbonate (TUMS - DOSED IN MG ELEMENTAL CALCIUM) 500 MG chewable tablet, Chew 1 tablet by mouth daily., Disp: , Rfl:    diltiazem (TIAZAC) 180 MG 24 hr capsule, Take 1 capsule (180 mg total) by mouth daily at 12 noon., Disp: 90 capsule, Rfl: 3   diphenhydrAMINE-APAP, sleep, (TYLENOL PM EXTRA STRENGTH PO), Take 0.5 tablets by mouth at bedtime as needed (Sleep)., Disp: , Rfl:    HYDROcodone-acetaminophen (NORCO/VICODIN) 5-325 MG tablet, Take 1 tablet by mouth every 4 (four) hours as needed for moderate pain. , Disp: , Rfl:    levalbuterol (XOPENEX HFA) 45 MCG/ACT inhaler, Inhale into the lungs., Disp: , Rfl:    levothyroxine (SYNTHROID) 50 MCG tablet, Take 1 tablet by mouth daily., Disp: , Rfl:    melatonin 1 MG TABS tablet, Take 3-5 mg by mouth at bedtime as needed (for sleep)., Disp:  , Rfl:    nitroGLYCERIN (NITROSTAT) 0.4 MG SL tablet, Place 1 tablet (0.4 mg total) under the tongue every 5 (five) minutes as needed for up to 25 days for chest pain., Disp: 25 tablet, Rfl: 3   pantoprazole (PROTONIX) 40 MG tablet, Take 40 mg by mouth daily before breakfast., Disp: , Rfl:    psyllium (METAMUCIL) 58.6 % packet, Take 1 packet by mouth daily., Disp: , Rfl:    spironolactone (ALDACTONE) 25 MG tablet, TAKE 1 TABLET(25 MG) BY MOUTH DAILY, Disp: 90 tablet, Rfl: 3   Tiotropium Bromide-Olodaterol 2.5-2.5 MCG/ACT AERS, Inhale 2 puffs into the lungs daily., Disp: 3 each, Rfl: 3  Cardiovascular studies:  EKG 12/07/2022: Sinus rhythm 66 bpm Leftward axis Poor R-wave progression Low voltage   Echocardiogram 03/16/2022:   Normal LV systolic function with EF 64%. Left ventricle cavity is normal  in size. Normal left ventricular wall thickness. Normal global wall  motion. Doppler evidence of grade I (impaired) diastolic dysfunction,  normal LAP. Calculated EF 64%. Frequent PACs.  Left atrial cavity is mildly dilated.  Trileaflet aortic valve.  Mild (Grade I) aortic regurgitation. Mild aortic  valve leaflet thickening.  Mild posterior mitral valve leaflet calcification. Normal mitral valve  leaflet mobility without restriction. Borderline MV prolapse.  Mild to at  most moderate mitral regurgitation.  Structurally normal tricuspid valve.  Mild tricuspid regurgitation. No  evidence of  pulmonary hypertension.  Structurally normal pulmonic valve.  Mild pulmonic regurgitation.   Event monitor 09/17/2019 - 09/30/2019: Diagnostic time: 99%  Dominant rhythm: Sinus. HR 44-136 bpm. Avg HR 63 bpm. Episodes of sustained Afib with RVR up to 136 bpm Afib burden 10% No atrial flutter/SVT/VT/high grade AV block, sinus pause >3sec noted. Manually triggered episodes were accidental push, correlated with Afib.  Lexiscan/modified Bruce Tetrofosmin stress test 08/26/2019: Lexiscan/modified Bruce  nuclear stress test performed using 1-day protocol. Stress EKG is non-diagnostic, as this is pharmacological stress test. In addition, stress EKG at 79% MPHR showed sinus tachycardia, <1 mm ST depression in inferolateral leads.  Normal myocardial perfusion. Stress LVEF 62%. Low risk study.   Echocardiogram 08/13/2019:  Normal LV systolic function with visual EF 55-60%. Left ventricle cavity  is normal in size. Mild left ventricular hypertrophy. Normal global wall  motion. Normal diastolic filling pattern, normal LAP.  Mild calcification of the mitral valve annulus. Mild mitral valve leaflet  thickening. Mildly restricted mitral valve leaflets. Moderate (Grade II)  mitral regurgitation.  Mild tricuspid regurgitation.  Mild pulmonary hypertension. RVSP measures 37 mmHg.  Mild pulmonic regurgitation.  IVC is dilated with respiratory variation.  Compared to prior study 11/23/2018 MR is now moderate and pulmonary  hypertension is new.   Carotid artery duplex  08/13/2019:  Minimal stenosis in the right internal carotid artery (1-15%).  Stenosis in the left internal carotid artery (16-49%).  Antegrade right vertebral artery flow. Antegrade left vertebral artery  flow.  Follow up in one year is appropriate if clinically indicated.  CTA Chest 07/27/2019: 1. No evidence of acute abnormality. No evidence of pulmonary emboli. 2. Coronary artery disease. LAD and RCA coronary acalcifications 3. Aortic Atherosclerosis (ICD10-I70.0) and Emphysema (ICD10-J43.9).     Recent labs: 01/11/2022: Glucose 81, BUN/Cr 24/1.01. EGFR 56. Na/K 135/4.6. T. Bili 1.3. Rest of the CMP normal H/H 14/42. MCV 97. Platelets 411 HbA1C NA   05/25/2021: Glucose 98, BUN/Cr 17/0.93. EGFR 62. Na/K 137/5.1. Rest of the CMP normal H/H 13/39. MCV 96. Platelets 343 HbA1C NA Chol 131, TG 105, HDL 56, LDL 56 TSH 1.4 normal  07/08/2020: Glucose 99, BUN/Cr 20/0.97. EGFR 59. Na/K 136/4.9.   Review of Systems   Cardiovascular:  Positive for palpitations (Occasional). Negative for chest pain, dyspnea on exertion, leg swelling and syncope.        Vitals:   12/07/22 1310  BP: 125/64  Pulse: 73  Resp: 16  SpO2: 96%      Body mass index is 21.47 kg/m. Filed Weights   12/07/22 1310  Weight: 129 lb (58.5 kg)      Objective:   Physical Exam Vitals and nursing note reviewed.  Constitutional:      General: She is not in acute distress. Neck:     Vascular: No JVD.  Cardiovascular:     Rate and Rhythm: Normal rate and regular rhythm.     Heart sounds: Murmur heard.     High-pitched blowing holosystolic murmur is present with a grade of 3/6 at the apex.  Pulmonary:     Effort: Pulmonary effort is normal.     Breath sounds: Normal breath sounds. No wheezing or rales.  Musculoskeletal:     Right lower leg: No edema.        Assessment & Recommendations:   82 y.o. Caucasian female with hypertension, paroxysmal atrial fibrillation, nonobstructive CAD, COPD  PAF: Maintaining sinus rhythm.  No recurrent paroxysmal A. fib episodes recently. H/o Amiodarone induced hypothyroidism.  Tolerating diltiazem 180 mg daily. In addition, prescribed diltiazem 30 mg 4 3 times daily as needed with episodes of rapid heartbeat. CHA2DS2VAsc score 4, annual stroke risk 5%. With age >80-year, and weight <60 kg, recommend Eliquis 2.5 mg twice daily.    Mitral regurgitation: Mild to moderate, clinically stable.  CAD without angina: While she does have LAD and RCA calcification and very likely has multivessel coronary artery disease, her stress test does not show any ischemia (08/2019)  Lipids very well controlled.  Hypertension: Well controlled. Refilled spironolactone, which has also helped with leg edema.   F/u in 6 months   Elder Negus, MD Pager: [phone] Office: [phone]

## 2022-12-12 ENCOUNTER — Ambulatory Visit: Payer: Medicare PPO | Admitting: Cardiology

## 2022-12-13 ENCOUNTER — Ambulatory Visit: Payer: Medicare PPO | Admitting: Cardiology

## 2022-12-14 ENCOUNTER — Other Ambulatory Visit: Payer: Self-pay

## 2022-12-14 DIAGNOSIS — I480 Paroxysmal atrial fibrillation: Secondary | ICD-10-CM

## 2022-12-14 MED ORDER — APIXABAN 2.5 MG PO TABS: 2.5000 mg | ORAL_TABLET | Freq: Two times a day (BID) | ORAL | 3 refills | Status: DC

## 2022-12-16 ENCOUNTER — Observation Stay (HOSPITAL_COMMUNITY): Payer: Medicare PPO

## 2022-12-16 ENCOUNTER — Emergency Department (HOSPITAL_COMMUNITY): Payer: Medicare PPO

## 2022-12-16 ENCOUNTER — Observation Stay (HOSPITAL_COMMUNITY)
Admission: EM | Admit: 2022-12-16 | Discharge: 2022-12-17 | Disposition: A | Discharge status: Home Health | Payer: Medicare PPO | Attending: Internal Medicine | Admitting: Internal Medicine

## 2022-12-16 ENCOUNTER — Other Ambulatory Visit: Payer: Self-pay

## 2022-12-16 ENCOUNTER — Encounter (HOSPITAL_COMMUNITY): Payer: Self-pay

## 2022-12-16 DIAGNOSIS — D819 Combined immunodeficiency, unspecified: Secondary | ICD-10-CM | POA: Diagnosis not present

## 2022-12-16 DIAGNOSIS — R2681 Unsteadiness on feet: Secondary | ICD-10-CM | POA: Insufficient documentation

## 2022-12-16 DIAGNOSIS — I10 Essential (primary) hypertension: Secondary | ICD-10-CM | POA: Insufficient documentation

## 2022-12-16 DIAGNOSIS — R54 Age-related physical debility: Secondary | ICD-10-CM | POA: Diagnosis not present

## 2022-12-16 DIAGNOSIS — Z79899 Other long term (current) drug therapy: Secondary | ICD-10-CM | POA: Diagnosis not present

## 2022-12-16 DIAGNOSIS — I482 Chronic atrial fibrillation, unspecified: Secondary | ICD-10-CM | POA: Insufficient documentation

## 2022-12-16 DIAGNOSIS — E871 Hypo-osmolality and hyponatremia: Secondary | ICD-10-CM | POA: Diagnosis not present

## 2022-12-16 DIAGNOSIS — R519 Headache, unspecified: Secondary | ICD-10-CM

## 2022-12-16 DIAGNOSIS — D709 Neutropenia, unspecified: Secondary | ICD-10-CM | POA: Insufficient documentation

## 2022-12-16 DIAGNOSIS — I4891 Unspecified atrial fibrillation: Secondary | ICD-10-CM | POA: Diagnosis present

## 2022-12-16 DIAGNOSIS — M6281 Muscle weakness (generalized): Secondary | ICD-10-CM | POA: Insufficient documentation

## 2022-12-16 DIAGNOSIS — F17210 Nicotine dependence, cigarettes, uncomplicated: Secondary | ICD-10-CM | POA: Insufficient documentation

## 2022-12-16 DIAGNOSIS — U071 COVID-19: Secondary | ICD-10-CM | POA: Insufficient documentation

## 2022-12-16 DIAGNOSIS — R2689 Other abnormalities of gait and mobility: Secondary | ICD-10-CM | POA: Diagnosis not present

## 2022-12-16 DIAGNOSIS — R531 Weakness: Secondary | ICD-10-CM

## 2022-12-16 DIAGNOSIS — R262 Difficulty in walking, not elsewhere classified: Secondary | ICD-10-CM | POA: Insufficient documentation

## 2022-12-16 DIAGNOSIS — Z7901 Long term (current) use of anticoagulants: Secondary | ICD-10-CM | POA: Insufficient documentation

## 2022-12-16 LAB — CREATININE, SERUM
Creatinine, Ser: 0.97 mg/dL (ref 0.44–1.00)
GFR, Estimated: 58 mL/min — ABNORMAL LOW (ref >60)

## 2022-12-16 LAB — BASIC METABOLIC PANEL
Anion gap: 9 (ref 5–15)
Anion gap: 11 (ref 5–15)
BUN: 17 mg/dL (ref 8–23)
BUN: 15 mg/dL (ref 8–23)
CO2: 22 mmol/L (ref 22–32)
CO2: 20 mmol/L — ABNORMAL LOW (ref 22–32)
Calcium: 8.6 mg/dL — ABNORMAL LOW (ref 8.9–10.3)
Calcium: 8.5 mg/dL — ABNORMAL LOW (ref 8.9–10.3)
Chloride: 91 mmol/L — ABNORMAL LOW (ref 98–111)
Chloride: 97 mmol/L — ABNORMAL LOW (ref 98–111)
Creatinine, Ser: 1.01 mg/dL — ABNORMAL HIGH (ref 0.44–1.00)
Creatinine, Ser: 0.97 mg/dL (ref 0.44–1.00)
GFR, Estimated: 55 mL/min — ABNORMAL LOW (ref >60)
GFR, Estimated: 58 mL/min — ABNORMAL LOW (ref >60)
Glucose, Bld: 91 mg/dL (ref 70–99)
Glucose, Bld: 89 mg/dL (ref 70–99)
Potassium: 4.6 mmol/L (ref 3.5–5.1)
Potassium: 4.7 mmol/L (ref 3.5–5.1)
Sodium: 122 mmol/L — ABNORMAL LOW (ref 135–145)
Sodium: 128 mmol/L — ABNORMAL LOW (ref 135–145)

## 2022-12-16 LAB — I-STAT CHEM 8, ED
BUN: 14 mg/dL (ref 8–23)
Calcium, Ion: 1.05 mmol/L — ABNORMAL LOW (ref 1.15–1.40)
Chloride: 102 mmol/L (ref 98–111)
Creatinine, Ser: 0.7 mg/dL (ref 0.44–1.00)
Glucose, Bld: 78 mg/dL (ref 70–99)
HCT: 34 % — ABNORMAL LOW (ref 36.0–46.0)
Hemoglobin: 11.6 g/dL — ABNORMAL LOW (ref 12.0–15.0)
Potassium: 4.2 mmol/L (ref 3.5–5.1)
Sodium: 133 mmol/L — ABNORMAL LOW (ref 135–145)
TCO2: 18 mmol/L — ABNORMAL LOW (ref 22–32)

## 2022-12-16 LAB — CBC
HCT: 39.5 % (ref 36.0–46.0)
Hemoglobin: 14.1 g/dL (ref 12.0–15.0)
MCH: 32.9 pg (ref 26.0–34.0)
MCHC: 35.7 g/dL (ref 30.0–36.0)
MCV: 92.3 fL (ref 80.0–100.0)
Platelets: 238 10*3/uL (ref 150–400)
RBC: 4.28 MIL/uL (ref 3.87–5.11)
RDW: 12.1 % (ref 11.5–15.5)
WBC: 4.3 10*3/uL (ref 4.0–10.5)
nRBC: 0 % (ref 0.0–0.2)

## 2022-12-16 LAB — CBC WITH DIFFERENTIAL/PLATELET
Abs Immature Granulocytes: 0 10*3/uL (ref 0.00–0.07)
Basophils Absolute: 0 10*3/uL (ref 0.0–0.1)
Basophils Relative: 0 %
Eosinophils Absolute: 0 10*3/uL (ref 0.0–0.5)
Eosinophils Relative: 0 %
HCT: 36.7 % (ref 36.0–46.0)
Hemoglobin: 13.3 g/dL (ref 12.0–15.0)
Immature Granulocytes: 0 %
Lymphocytes Relative: 25 %
Lymphs Abs: 0.7 10*3/uL (ref 0.7–4.0)
MCH: 33.8 pg (ref 26.0–34.0)
MCHC: 36.2 g/dL — ABNORMAL HIGH (ref 30.0–36.0)
MCV: 93.1 fL (ref 80.0–100.0)
Monocytes Absolute: 0.5 10*3/uL (ref 0.1–1.0)
Monocytes Relative: 17 %
Neutro Abs: 1.6 10*3/uL — ABNORMAL LOW (ref 1.7–7.7)
Neutrophils Relative %: 58 %
Platelets: 222 10*3/uL (ref 150–400)
RBC: 3.94 MIL/uL (ref 3.87–5.11)
RDW: 12 % (ref 11.5–15.5)
WBC: 2.7 10*3/uL — ABNORMAL LOW (ref 4.0–10.5)
nRBC: 0 % (ref 0.0–0.2)

## 2022-12-16 LAB — RESP PANEL BY RT-PCR (RSV, FLU A&B, COVID)  RVPGX2
Influenza A by PCR: NEGATIVE
Influenza B by PCR: NEGATIVE
Resp Syncytial Virus by PCR: NEGATIVE
SARS Coronavirus 2 by RT PCR: POSITIVE — AB

## 2022-12-16 MED ORDER — ATORVASTATIN CALCIUM 10 MG PO TABS
10.0000 mg | ORAL_TABLET | Freq: Every day | ORAL | Status: DC
  Administered 2022-12-17: 10 mg via ORAL
  Filled 2022-12-16: qty 1

## 2022-12-16 MED ORDER — ACETAMINOPHEN 325 MG PO TABS: 650.0000 mg | ORAL_TABLET | Freq: Four times a day (QID) | ORAL | Status: DC | PRN

## 2022-12-16 MED ORDER — UMECLIDINIUM BROMIDE 62.5 MCG/ACT IN AEPB
1.0000 | INHALATION_SPRAY | Freq: Every day | RESPIRATORY_TRACT | Status: DC
  Administered 2022-12-17: 1 via RESPIRATORY_TRACT
  Filled 2022-12-16: qty 7

## 2022-12-16 MED ORDER — TRAZODONE HCL 50 MG PO TABS
50.0000 mg | ORAL_TABLET | Freq: Every evening | ORAL | Status: DC | PRN
  Administered 2022-12-16: 50 mg via ORAL
  Filled 2022-12-16: qty 1

## 2022-12-16 MED ORDER — DIPHENHYDRAMINE HCL 50 MG/ML IJ SOLN
12.5000 mg | Freq: Once | INTRAMUSCULAR | Status: AC
  Administered 2022-12-16: 12.5 mg via INTRAVENOUS
  Filled 2022-12-16: qty 1

## 2022-12-16 MED ORDER — DILTIAZEM HCL ER COATED BEADS 180 MG PO CP24
180.0000 mg | ORAL_CAPSULE | Freq: Every day | ORAL | Status: DC
  Administered 2022-12-16: 180 mg via ORAL
  Filled 2022-12-16: qty 1

## 2022-12-16 MED ORDER — NIRMATRELVIR/RITONAVIR (PAXLOVID)TABLET: 3.0000 | ORAL_TABLET | Freq: Two times a day (BID) | ORAL | Status: DC

## 2022-12-16 MED ORDER — DILTIAZEM HCL ER COATED BEADS 180 MG PO CP24
180.0000 mg | ORAL_CAPSULE | Freq: Every day | ORAL | Status: DC
  Administered 2022-12-17: 180 mg via ORAL
  Filled 2022-12-16: qty 1

## 2022-12-16 MED ORDER — APIXABAN 5 MG PO TABS
5.0000 mg | ORAL_TABLET | Freq: Two times a day (BID) | ORAL | Status: DC
  Administered 2022-12-16: 2.5 mg via ORAL
  Filled 2022-12-16: qty 1

## 2022-12-16 MED ORDER — SODIUM CHLORIDE 0.9% FLUSH
3.0000 mL | Freq: Two times a day (BID) | INTRAVENOUS | Status: DC
  Administered 2022-12-16 – 2022-12-17 (×2): 3 mL via INTRAVENOUS

## 2022-12-16 MED ORDER — ACETAMINOPHEN 650 MG RE SUPP: 650.0000 mg | Freq: Four times a day (QID) | RECTAL | Status: DC | PRN

## 2022-12-16 MED ORDER — SODIUM CHLORIDE 0.9 % IV BOLUS
1000.0000 mL | Freq: Once | INTRAVENOUS | Status: AC
  Administered 2022-12-16: 1000 mL via INTRAVENOUS

## 2022-12-16 MED ORDER — POLYETHYLENE GLYCOL 3350 17 G PO PACK: 17.0000 g | PACK | Freq: Every day | ORAL | Status: DC | PRN

## 2022-12-16 MED ORDER — SODIUM CHLORIDE 0.9 % IV SOLN
100.0000 mg | Freq: Every day | INTRAVENOUS | Status: DC
  Filled 2022-12-16: qty 20

## 2022-12-16 MED ORDER — LACTATED RINGERS IV BOLUS
1000.0000 mL | Freq: Once | INTRAVENOUS | Status: AC
  Administered 2022-12-16: 1000 mL via INTRAVENOUS

## 2022-12-16 MED ORDER — HYDROCODONE-ACETAMINOPHEN 5-325 MG PO TABS
0.5000 | ORAL_TABLET | Freq: Two times a day (BID) | ORAL | Status: DC | PRN
  Administered 2022-12-17: 0.5 via ORAL
  Filled 2022-12-16: qty 1

## 2022-12-16 MED ORDER — SODIUM CHLORIDE 0.9 % IV SOLN
200.0000 mg | Freq: Once | INTRAVENOUS | Status: DC
  Filled 2022-12-16: qty 40

## 2022-12-16 MED ORDER — ARFORMOTEROL TARTRATE 15 MCG/2ML IN NEBU
15.0000 ug | INHALATION_SOLUTION | Freq: Two times a day (BID) | RESPIRATORY_TRACT | Status: DC
  Administered 2022-12-17: 15 ug via RESPIRATORY_TRACT
  Filled 2022-12-16: qty 2

## 2022-12-16 MED ORDER — PROCHLORPERAZINE EDISYLATE 10 MG/2ML IJ SOLN
10.0000 mg | Freq: Once | INTRAMUSCULAR | Status: AC
  Administered 2022-12-16: 10 mg via INTRAVENOUS
  Filled 2022-12-16: qty 2

## 2022-12-16 MED ORDER — LEVOTHYROXINE SODIUM 50 MCG PO TABS
50.0000 ug | ORAL_TABLET | Freq: Every day | ORAL | Status: DC
  Administered 2022-12-17: 50 ug via ORAL
  Filled 2022-12-16: qty 1

## 2022-12-16 MED ORDER — PANTOPRAZOLE SODIUM 40 MG PO TBEC
40.0000 mg | DELAYED_RELEASE_TABLET | Freq: Every day | ORAL | Status: DC
  Administered 2022-12-17: 40 mg via ORAL
  Filled 2022-12-16: qty 1

## 2022-12-16 MED ORDER — LATANOPROST 0.005 % OP SOLN
1.0000 [drp] | Freq: Every day | OPHTHALMIC | Status: DC
  Filled 2022-12-16: qty 2.5

## 2022-12-16 MED ORDER — APIXABAN 2.5 MG PO TABS
2.5000 mg | ORAL_TABLET | Freq: Two times a day (BID) | ORAL | Status: DC
  Administered 2022-12-17: 2.5 mg via ORAL
  Filled 2022-12-16: qty 1

## 2022-12-16 MED ORDER — ONDANSETRON HCL 4 MG/2ML IJ SOLN
4.0000 mg | Freq: Once | INTRAMUSCULAR | Status: AC
  Administered 2022-12-16: 4 mg via INTRAVENOUS
  Filled 2022-12-16: qty 2

## 2022-12-16 NOTE — ED Notes (Signed)
Help get patient into a gown on the monitor did EKG shown to er provider got patient a warm blanket patient is resting with call bell in reach

## 2022-12-16 NOTE — Assessment & Plan Note (Addendum)
Patient is unvaccinated, has atypical symptoms in terms of not having any diarrhea or any respiratory symptoms at presentation.  However I do feel that this was likely an acute illness.  We will get a chest x-ray, given patient's prominent complaint of reduced appetite and vomiting, I will get x-ray of the abdomen LS as well.  Given her diagnosis of COPD, and unvaccinated status I do feel patient is at moderate to elevated risk of progression.  I offered her Paxlovid initially, however it was contraindicated with apixaban use.  Therefore I offered her remdesivir, however patient declined remdesivir due to concerns of nephrotoxicity.  We will have to monitor the patient clinically at this time.  Follow-up chest x-ray and abdominal x-ray  Associated with mild headache, that has been going on for 3 days, at this time CT head nonactionable.  I will check a sed rate, however if headache resolves, I do not think further workup is indicated.

## 2022-12-16 NOTE — ED Provider Notes (Signed)
  Physical Exam  BP (!) 156/73   Pulse 80   Temp 97.8 F (36.6 C) (Oral)   Resp 16   Ht 5\' 5"  (1.651 m)   Wt 58 kg   SpO2 95%   BMI 21.28 kg/m   Physical Exam Vitals and nursing note reviewed.  Constitutional:      General: She is not in acute distress.    Appearance: She is ill-appearing.  HENT:     Head: Normocephalic and atraumatic.     Mouth/Throat:     Comments: Dry Cardiovascular:     Rate and Rhythm: Normal rate and regular rhythm.  Pulmonary:     Effort: Pulmonary effort is normal. No respiratory distress.  Neurological:     Mental Status: She is alert and oriented to person, place, and time.     GCS: GCS eye subscore is 4. GCS verbal subscore is 5. GCS motor subscore is 6.     Cranial Nerves: No cranial nerve deficit, dysarthria or facial asymmetry.     Procedures  Procedures  ED Course / MDM   Clinical Course as of 12/16/22 1725  Fri Dec 16, 2022  1426 Sodium(!): 122 [MC]  1426 SARS Coronavirus 2 by RT PCR(!): POSITIVE [MC]  1426 WBC(!): 2.7 [MC]  1520 3 days of headache with nausea and vomiting, on spironolactone. New hyponatremia. - F/u CT scans [KH]    Clinical Course User Index [KH] Jill Cooper, DO [MC] Lenard Simmer, PA-C   Medical Decision Making Amount and/or Complexity of Data Reviewed Labs: ordered. Decision-making details documented in ED Course. Radiology: ordered.  Risk Prescription drug management. Decision regarding hospitalization.   At the time my assumption of care, patient is afebrile, hemodynamically stable, in no acute distress.  Results reviewed.  CT head and C-spine are without acute intracranial abnormality or fracture or malalignment of the C-spine.  Patient reassessed.  She is reporting some improvement in her headache following migraine cocktail given by prior team.  Review of earlier results demonstrates that she was initially found to be hyponatremic as well as COVID-positive.  She does tell me that she has had  decreased appetite and p.o. intake with some vomiting over the past several days secondary to her illness.  She has received 1 L of LR as well as an additional liter of normal saline.  Given her fluid resuscitation, will repeat sodium.  Additionally contacted hospitalist for admission for hyponatremia.  I-STAT Chem-8 was obtained and demonstrates sodium increased from 1 22-1 33.  Patient does not have any evidence of neurodeficit or altered mental status on my exam.  Will obtain repeat BMP to confirm sodium.  While waiting for BMP I did speak with hospitalist regarding potential admission.  Dr. Jomarie Longs evaluated patient at bedside and feels that she may be appropriate for discharge if her repeat BMP demonstrates improvement in her sodium.  Repeat BMP demonstrates sodium increased to 128 which is an appropriate correction.  I contacted hospitalist to confirm disposition however hospitalist team was now post shift change.  Discussed case with Dr. Maryjean Ka who is now on-call.  Discussed earlier recommendations and patient current presentation.  Given her age and hyponatremia, he recommends observation for trend of sodium.  Patient will be admitted to hospitalist for overnight observation to continue to monitor this.  No further acute eval under my care in the emergency department.      Jill Cooper, DO 12/16/22 2330    Melene Plan, DO 12/19/22 1507

## 2022-12-16 NOTE — Progress Notes (Signed)
  Brief TRH consult note 82/F w/ AFib, CAD presented to the ED w/ headache and an episode of vomiting this am, presented to ED, VSS, Diagnosed w/ Covid without any Resp symptoms, no further vomiting, tolerating crackers and drink. In ED noted to have Na of 122 this am, given 2L fluids in ED. Repeat Na before I saw her was 133 drawn by ISTAT. Pt seen and examined, headache improved, no resp symptoms, no further vomiting VSS, no Neurological symptoms,  Do not see indication for admission at this time, avoid further fluid bolus recommended conservative mgt, holding aldactone for 2-3days, plenty of fluids and rest and FU with PCP in 1 week ED Resident has ordered another repeat BMP now  Zannie Cove, MD

## 2022-12-16 NOTE — Assessment & Plan Note (Addendum)
Given the history as well as response of the patient to fluid bolus given in the ER this is highly concerning for prerenal/hypovolemic hyponatremia that has improved from 1 22-1 28.  At this time I will hold off on further fluids, check TSH cortisol, give regular diet and trend.  Restart spironolactone once acute illness resolves

## 2022-12-16 NOTE — ED Triage Notes (Addendum)
[facility] from home d/t 3 days of HA, intermittent, not better with tylenol. Mild photophobia. Pain behind her eyes. Also reports 2 episodes N/V. Denies fever/trauma. A+Ox4, ambulatory on scene.

## 2022-12-16 NOTE — ED Notes (Signed)
ED TO INPATIENT HANDOFF REPORT  ED Nurse Name and Phone #: Dahlia Client [phone]  S Name/Age/Gender Jill Jackson 82 y.o. female Room/Bed: 021C/021C  Code Status   Code Status: Prior  Home/SNF/Other Home Patient oriented to: self, place, time, and situation Is this baseline? Yes   Triage Complete: Triage complete  Chief Complaint HA/ N/V  Triage Note [facility] from home d/t 3 days of HA, intermittent, not better with tylenol. Mild photophobia. Pain behind her eyes. Also reports 2 episodes N/V. Denies fever/trauma. A+Ox4, ambulatory on scene.    Allergies Allergies  Allergen Reactions   Tetracyclines & Related Nausea And Vomiting    Level of Care/Admitting Diagnosis ED Disposition     ED Disposition  Admit   Condition  --   Comment  The patient appears reasonably stabilized for admission considering the current resources, flow, and capabilities available in the ED at this time, and I doubt any other [facility] requiring further screening and/or treatment in the ED prior to admission is  present.          B Medical/Surgery History Past Medical History:  Diagnosis Date   Atrial fibrillation (HCC) 11/01/2018   HLD (hyperlipidemia) 11/01/2018   Hypertension    Past Surgical History:  Procedure Laterality Date   TUBAL LIGATION  1977     A IV Location/Drains/Wounds Patient Lines/Drains/Airways Status     Active Line/Drains/Airways     Name Placement date Placement time Site Days   Peripheral IV 12/16/22 20 G Right Antecubital 12/16/22  1239  Antecubital  less than 1            Intake/Output Last 24 hours  Intake/Output Summary (Last 24 hours) at 12/16/2022 1718 Last data filed at 12/16/2022 1446 Gross per 24 hour  Intake 2000 ml  Output --  Net 2000 ml    Labs/Imaging Results for orders placed or performed during the hospital encounter of 12/16/22 (from the past 48 hour(s))  Basic metabolic panel     Status: Abnormal   Collection Time: 12/16/22 12:40  PM  Result Value Ref Range   Sodium 122 (L) 135 - 145 mmol/L   Potassium 4.6 3.5 - 5.1 mmol/L   Chloride 91 (L) 98 - 111 mmol/L   CO2 22 22 - 32 mmol/L   Glucose, Bld 91 70 - 99 mg/dL    Comment: Glucose reference range applies only to samples taken after fasting for at least 8 hours.   BUN 17 8 - 23 mg/dL   Creatinine, Ser 9.52 (H) 0.44 - 1.00 mg/dL   Calcium 8.6 (L) 8.9 - 10.3 mg/dL   GFR, Estimated 55 (L) >60 mL/min    Comment: (NOTE) Calculated using the CKD-EPI Creatinine Equation (2021)    Anion gap 9 5 - 15    Comment: Performed at [facility], [address] [place], [place] [address]  CBC with Differential     Status: Abnormal   Collection Time: 12/16/22 12:40 PM  Result Value Ref Range   WBC 2.7 (L) 4.0 - 10.5 K/uL   RBC 3.94 3.87 - 5.11 MIL/uL   Hemoglobin 13.3 12.0 - 15.0 g/dL   HCT 44.0 10.2 - 72.5 %   MCV 93.1 80.0 - 100.0 fL   MCH 33.8 26.0 - 34.0 pg   MCHC 36.2 (H) 30.0 - 36.0 g/dL   RDW 36.6 44.0 - 34.7 %   Platelets 222 150 - 400 K/uL   nRBC 0.0 0.0 - 0.2 %   Neutrophils Relative %  58 %   Neutro Abs 1.6 (L) 1.7 - 7.7 K/uL   Lymphocytes Relative 25 %   Lymphs Abs 0.7 0.7 - 4.0 K/uL   Monocytes Relative 17 %   Monocytes Absolute 0.5 0.1 - 1.0 K/uL   Eosinophils Relative 0 %   Eosinophils Absolute 0.0 0.0 - 0.5 K/uL   Basophils Relative 0 %   Basophils Absolute 0.0 0.0 - 0.1 K/uL   Immature Granulocytes 0 %   Abs Immature Granulocytes 0.00 0.00 - 0.07 K/uL    Comment: Performed at [facility], [address] [place], [place] [address]  Resp panel by RT-PCR (RSV, Flu A&B, Covid) Anterior Nasal Swab     Status: Abnormal   Collection Time: 12/16/22 12:40 PM   Specimen: Anterior Nasal Swab  Result Value Ref Range   SARS Coronavirus 2 by RT PCR POSITIVE (A) NEGATIVE   Influenza A by PCR NEGATIVE NEGATIVE   Influenza B by PCR NEGATIVE NEGATIVE    Comment: (NOTE) The Xpert Xpress SARS-CoV-2/FLU/RSV plus assay is intended as an aid in the  diagnosis of influenza from Nasopharyngeal swab specimens and should not be used as a sole basis for treatment. Nasal washings and aspirates are unacceptable for Xpert Xpress SARS-CoV-2/FLU/RSV testing.  Fact Sheet for Patients: BloggerCourse.com  Fact Sheet for Healthcare Providers: SeriousBroker.it  This test is not yet approved or cleared by the [place] FDA and has been authorized for detection and/or diagnosis of SARS-CoV-2 by FDA under an Emergency Use Authorization (EUA). This EUA will remain in effect (meaning this test can be used) for the duration of the COVID-19 declaration under Section 564(b)(1) of the Act, 21 U.S.C. section 360bbb-3(b)(1), unless the authorization is terminated or revoked.     Resp Syncytial Virus by PCR NEGATIVE NEGATIVE    Comment: (NOTE) Fact Sheet for Patients: BloggerCourse.com  Fact Sheet for Healthcare Providers: SeriousBroker.it  This test is not yet approved or cleared by the [place] FDA and has been authorized for detection and/or diagnosis of SARS-CoV-2 by FDA under an Emergency Use Authorization (EUA). This EUA will remain in effect (meaning this test can be used) for the duration of the COVID-19 declaration under Section 564(b)(1) of the Act, 21 U.S.C. section 360bbb-3(b)(1), unless the authorization is terminated or revoked.  Performed at [facility], [address] [place], [place] [address]   I-stat chem 8, ED (not at [facility], [facility] or [facility])     Status: Abnormal   Collection Time: 12/16/22  5:12 PM  Result Value Ref Range   Sodium 133 (L) 135 - 145 mmol/L   Potassium 4.2 3.5 - 5.1 mmol/L   Chloride 102 98 - 111 mmol/L   BUN 14 8 - 23 mg/dL   Creatinine, Ser 8.65 0.44 - 1.00 mg/dL   Glucose, Bld 78 70 - 99 mg/dL    Comment: Glucose reference range applies only to samples taken after fasting for at least 8 hours.    Calcium, Ion 1.05 (L) 1.15 - 1.40 mmol/L   TCO2 18 (L) 22 - 32 mmol/L   Hemoglobin 11.6 (L) 12.0 - 15.0 g/dL   HCT 78.4 (L) 69.6 - 29.5 %   CT Head Wo Contrast  Result Date: 12/16/2022 CLINICAL DATA:  Headache, new onset (Age >= 51y); Neck pain, chronic with worsening headache EXAM: CT HEAD WITHOUT CONTRAST CT CERVICAL SPINE WITHOUT CONTRAST TECHNIQUE: Multidetector CT imaging of the head and cervical spine was performed following the standard protocol without intravenous contrast.  Multiplanar CT image reconstructions of the cervical spine were also generated. RADIATION DOSE REDUCTION: This exam was performed according to the departmental dose-optimization program which includes automated exposure control, adjustment of the mA and/or kV according to patient size and/or use of iterative reconstruction technique. COMPARISON:  None Available. FINDINGS: CT HEAD FINDINGS Brain: No hemorrhage. No hydrocephalus. No extra-axial fluid collection. No CT evidence of an acute cortical infarct. Mass effect. No mass lesion. There is sequela of mild chronic microvascular ischemic change. Vascular: No hyperdense vessel or unexpected calcification. Skull: Normal. Negative for fracture or focal lesion. Sinuses/Orbits: Moderate right-sided mastoid effusion and middle ear effusion. No left-sided mastoid or middle ear effusion. Paranasal sinuses are clear. Bilateral lens replacement. Orbits are otherwise unremarkable. Other: None. CT CERVICAL SPINE FINDINGS Alignment: Grade 1 anterolisthesis of C4 on C5. Skull base and vertebrae: No acute fracture. No primary bone lesion or focal pathologic process. Soft tissues and spinal canal: No prevertebral fluid or swelling. No visible canal hematoma. Disc levels:  No CT evidence of high-grade spinal canal stenosis. Upper chest: Negative Other: None IMPRESSION: 1. No acute intracranial abnormality. 2. No acute fracture or traumatic malalignment of the cervical spine. 3. Moderate right-sided  mastoid effusion and middle ear effusion. Correlate with physical exam for signs of otomastoiditis. Electronically Signed   By: Lorenza Cambridge M.D.   On: 12/16/2022 16:48   CT Cervical Spine Wo Contrast  Result Date: 12/16/2022 CLINICAL DATA:  Headache, new onset (Age >= 51y); Neck pain, chronic with worsening headache EXAM: CT HEAD WITHOUT CONTRAST CT CERVICAL SPINE WITHOUT CONTRAST TECHNIQUE: Multidetector CT imaging of the head and cervical spine was performed following the standard protocol without intravenous contrast. Multiplanar CT image reconstructions of the cervical spine were also generated. RADIATION DOSE REDUCTION: This exam was performed according to the departmental dose-optimization program which includes automated exposure control, adjustment of the mA and/or kV according to patient size and/or use of iterative reconstruction technique. COMPARISON:  None Available. FINDINGS: CT HEAD FINDINGS Brain: No hemorrhage. No hydrocephalus. No extra-axial fluid collection. No CT evidence of an acute cortical infarct. Mass effect. No mass lesion. There is sequela of mild chronic microvascular ischemic change. Vascular: No hyperdense vessel or unexpected calcification. Skull: Normal. Negative for fracture or focal lesion. Sinuses/Orbits: Moderate right-sided mastoid effusion and middle ear effusion. No left-sided mastoid or middle ear effusion. Paranasal sinuses are clear. Bilateral lens replacement. Orbits are otherwise unremarkable. Other: None. CT CERVICAL SPINE FINDINGS Alignment: Grade 1 anterolisthesis of C4 on C5. Skull base and vertebrae: No acute fracture. No primary bone lesion or focal pathologic process. Soft tissues and spinal canal: No prevertebral fluid or swelling. No visible canal hematoma. Disc levels:  No CT evidence of high-grade spinal canal stenosis. Upper chest: Negative Other: None IMPRESSION: 1. No acute intracranial abnormality. 2. No acute fracture or traumatic malalignment of the  cervical spine. 3. Moderate right-sided mastoid effusion and middle ear effusion. Correlate with physical exam for signs of otomastoiditis. Electronically Signed   By: Lorenza Cambridge M.D.   On: 12/16/2022 16:48    Pending Labs Unresulted Labs (From admission, onward)    None       Vitals/Pain Today's Vitals   12/16/22 1207 12/16/22 1400 12/16/22 1612 12/16/22 1614  BP:  125/71 (!) 156/73   Pulse:  63 80   Resp:  17 16   Temp:    97.8 F (36.6 C)  TempSrc:    Oral  SpO2:  97% 95%   Weight: 58 kg  Height: 5\' 5"  (1.651 m)     PainSc:        Isolation Precautions No active isolations  Medications Medications  lactated ringers bolus 1,000 mL (0 mLs Intravenous Stopped 12/16/22 1358)  ondansetron (ZOFRAN) injection 4 mg (4 mg Intravenous Given 12/16/22 1240)  prochlorperazine (COMPAZINE) injection 10 mg (10 mg Intravenous Given 12/16/22 1403)  diphenhydrAMINE (BENADRYL) injection 12.5 mg (12.5 mg Intravenous Given 12/16/22 1403)  sodium chloride 0.9 % bolus 1,000 mL (0 mLs Intravenous Stopped 12/16/22 1446)    Mobility walks with person assist     Focused Assessments Cardiac Assessment Handoff:  Cardiac Rhythm: Normal sinus rhythm No results found for: "CKTOTAL", "CKMB", "CKMBINDEX", "TROPONINI" Lab Results  Component Value Date   DDIMER 0.66 (H) 07/26/2019   Does the Patient currently have chest pain? No   , Pulmonary Assessment Handoff:  Lung sounds:   O2 Device: Room Air      R Recommendations: See Admitting Provider Note  Report given to:   Additional Notes: COVID+

## 2022-12-16 NOTE — ED Notes (Signed)
Patient transported to CT. Pt's daughter informed staff that she does not want pt to receive remdesivir "or any of those other covid medications". Daughter informed that pt has right to accept or refuse orders, as she is A+Ox4. Daughter verbalized understanding.

## 2022-12-16 NOTE — ED Notes (Signed)
ED TO INPATIENT HANDOFF REPORT  ED Nurse Name and Phone #:  Pollie Meyer [phone]  S Name/Age/Gender Jill Jackson 82 y.o. female Room/Bed: 021C/021C  Code Status   Code Status: Full Code  Home/SNF/Other Home Patient oriented to: self, place, time, and situation Is this baseline? Yes   Triage Complete: Triage complete  Chief Complaint Hyponatremia [E87.1]  Triage Note [facility] from home d/t 3 days of HA, intermittent, not better with tylenol. Mild photophobia. Pain behind her eyes. Also reports 2 episodes N/V. Denies fever/trauma. A+Ox4, ambulatory on scene.    Allergies Allergies  Allergen Reactions   Tetracyclines & Related Nausea And Vomiting    Level of Care/Admitting Diagnosis ED Disposition     ED Disposition  Admit   Condition  --   Comment  Hospital Area: [facility] [[id number]]  Level of Care: Med-Surg [16]  May place patient in observation at [facility] or [facility] if equivalent level of care is available:: No  Covid Evaluation: Confirmed COVID Positive  Diagnosis: Hyponatremia [198519]  Admitting Physician: Nolberto Hanlon [[id number]]  Attending Physician: Nolberto Hanlon [[id number]]          B Medical/Surgery History Past Medical History:  Diagnosis Date   Atrial fibrillation (HCC) 11/01/2018   HLD (hyperlipidemia) 11/01/2018   Hypertension    Past Surgical History:  Procedure Laterality Date   TUBAL LIGATION  1977     A IV Location/Drains/Wounds Patient Lines/Drains/Airways Status     Active Line/Drains/Airways     Name Placement date Placement time Site Days   Peripheral IV 12/16/22 20 G Right Antecubital 12/16/22  1239  Antecubital  less than 1            Intake/Output Last 24 hours  Intake/Output Summary (Last 24 hours) at 12/16/2022 2151 Last data filed at 12/16/2022 1446 Gross per 24 hour  Intake 2000 ml  Output --  Net 2000 ml    Labs/Imaging Results for orders placed or performed during the hospital  encounter of 12/16/22 (from the past 48 hour(s))  Basic metabolic panel     Status: Abnormal   Collection Time: 12/16/22 12:40 PM  Result Value Ref Range   Sodium 122 (L) 135 - 145 mmol/L   Potassium 4.6 3.5 - 5.1 mmol/L   Chloride 91 (L) 98 - 111 mmol/L   CO2 22 22 - 32 mmol/L   Glucose, Bld 91 70 - 99 mg/dL    Comment: Glucose reference range applies only to samples taken after fasting for at least 8 hours.   BUN 17 8 - 23 mg/dL   Creatinine, Ser 1.91 (H) 0.44 - 1.00 mg/dL   Calcium 8.6 (L) 8.9 - 10.3 mg/dL   GFR, Estimated 55 (L) >60 mL/min    Comment: (NOTE) Calculated using the CKD-EPI Creatinine Equation (2021)    Anion gap 9 5 - 15    Comment: Performed at [facility], [address], [place], [place] [address]  CBC with Differential     Status: Abnormal   Collection Time: 12/16/22 12:40 PM  Result Value Ref Range   WBC 2.7 (L) 4.0 - 10.5 K/uL   RBC 3.94 3.87 - 5.11 MIL/uL   Hemoglobin 13.3 12.0 - 15.0 g/dL   HCT 56.2 13.0 - 86.5 %   MCV 93.1 80.0 - 100.0 fL   MCH 33.8 26.0 - 34.0 pg   MCHC 36.2 (H) 30.0 - 36.0 g/dL   RDW 78.4 69.6 - 29.5 %   Platelets  222 150 - 400 K/uL   nRBC 0.0 0.0 - 0.2 %   Neutrophils Relative % 58 %   Neutro Abs 1.6 (L) 1.7 - 7.7 K/uL   Lymphocytes Relative 25 %   Lymphs Abs 0.7 0.7 - 4.0 K/uL   Monocytes Relative 17 %   Monocytes Absolute 0.5 0.1 - 1.0 K/uL   Eosinophils Relative 0 %   Eosinophils Absolute 0.0 0.0 - 0.5 K/uL   Basophils Relative 0 %   Basophils Absolute 0.0 0.0 - 0.1 K/uL   Immature Granulocytes 0 %   Abs Immature Granulocytes 0.00 0.00 - 0.07 K/uL    Comment: Performed at [facility], [address] [place], [place] [address]  Resp panel by RT-PCR (RSV, Flu A&B, Covid) Anterior Nasal Swab     Status: Abnormal   Collection Time: 12/16/22 12:40 PM   Specimen: Anterior Nasal Swab  Result Value Ref Range   SARS Coronavirus 2 by RT PCR POSITIVE (A) NEGATIVE   Influenza A by PCR NEGATIVE NEGATIVE   Influenza  B by PCR NEGATIVE NEGATIVE    Comment: (NOTE) The Xpert Xpress SARS-CoV-2/FLU/RSV plus assay is intended as an aid in the diagnosis of influenza from Nasopharyngeal swab specimens and should not be used as a sole basis for treatment. Nasal washings and aspirates are unacceptable for Xpert Xpress SARS-CoV-2/FLU/RSV testing.  Fact Sheet for Patients: BloggerCourse.com  Fact Sheet for Healthcare Providers: SeriousBroker.it  This test is not yet approved or cleared by the [place] FDA and has been authorized for detection and/or diagnosis of SARS-CoV-2 by FDA under an Emergency Use Authorization (EUA). This EUA will remain in effect (meaning this test can be used) for the duration of the COVID-19 declaration under Section 564(b)(1) of the Act, 21 U.S.C. section 360bbb-3(b)(1), unless the authorization is terminated or revoked.     Resp Syncytial Virus by PCR NEGATIVE NEGATIVE    Comment: (NOTE) Fact Sheet for Patients: BloggerCourse.com  Fact Sheet for Healthcare Providers: SeriousBroker.it  This test is not yet approved or cleared by the [place] FDA and has been authorized for detection and/or diagnosis of SARS-CoV-2 by FDA under an Emergency Use Authorization (EUA). This EUA will remain in effect (meaning this test can be used) for the duration of the COVID-19 declaration under Section 564(b)(1) of the Act, 21 U.S.C. section 360bbb-3(b)(1), unless the authorization is terminated or revoked.  Performed at [facility], [address], [place], [place] [address]   I-stat chem 8, ED (not at [facility], [facility] or [facility])     Status: Abnormal   Collection Time: 12/16/22  5:12 PM  Result Value Ref Range   Sodium 133 (L) 135 - 145 mmol/L   Potassium 4.2 3.5 - 5.1 mmol/L   Chloride 102 98 - 111 mmol/L   BUN 14 8 - 23 mg/dL   Creatinine, Ser 1.02 0.44 - 1.00 mg/dL   Glucose,  Bld 78 70 - 99 mg/dL    Comment: Glucose reference range applies only to samples taken after fasting for at least 8 hours.   Calcium, Ion 1.05 (L) 1.15 - 1.40 mmol/L   TCO2 18 (L) 22 - 32 mmol/L   Hemoglobin 11.6 (L) 12.0 - 15.0 g/dL   HCT 72.5 (L) 36.6 - 44.0 %  Basic metabolic panel     Status: Abnormal   Collection Time: 12/16/22  5:29 PM  Result Value Ref Range   Sodium 128 (L) 135 - 145 mmol/L   Potassium 4.7  3.5 - 5.1 mmol/L   Chloride 97 (L) 98 - 111 mmol/L   CO2 20 (L) 22 - 32 mmol/L   Glucose, Bld 89 70 - 99 mg/dL    Comment: Glucose reference range applies only to samples taken after fasting for at least 8 hours.   BUN 15 8 - 23 mg/dL   Creatinine, Ser 4.09 0.44 - 1.00 mg/dL   Calcium 8.5 (L) 8.9 - 10.3 mg/dL   GFR, Estimated 58 (L) >60 mL/min    Comment: (NOTE) Calculated using the CKD-EPI Creatinine Equation (2021)    Anion gap 11 5 - 15    Comment: Performed at [facility], [address], [place], [place] [address]   CT Head Wo Contrast  Result Date: 12/16/2022 CLINICAL DATA:  Headache, new onset (Age >= 51y); Neck pain, chronic with worsening headache EXAM: CT HEAD WITHOUT CONTRAST CT CERVICAL SPINE WITHOUT CONTRAST TECHNIQUE: Multidetector CT imaging of the head and cervical spine was performed following the standard protocol without intravenous contrast. Multiplanar CT image reconstructions of the cervical spine were also generated. RADIATION DOSE REDUCTION: This exam was performed according to the departmental dose-optimization program which includes automated exposure control, adjustment of the mA and/or kV according to patient size and/or use of iterative reconstruction technique. COMPARISON:  None Available. FINDINGS: CT HEAD FINDINGS Brain: No hemorrhage. No hydrocephalus. No extra-axial fluid collection. No CT evidence of an acute cortical infarct. Mass effect. No mass lesion. There is sequela of mild chronic microvascular ischemic change. Vascular: No  hyperdense vessel or unexpected calcification. Skull: Normal. Negative for fracture or focal lesion. Sinuses/Orbits: Moderate right-sided mastoid effusion and middle ear effusion. No left-sided mastoid or middle ear effusion. Paranasal sinuses are clear. Bilateral lens replacement. Orbits are otherwise unremarkable. Other: None. CT CERVICAL SPINE FINDINGS Alignment: Grade 1 anterolisthesis of C4 on C5. Skull base and vertebrae: No acute fracture. No primary bone lesion or focal pathologic process. Soft tissues and spinal canal: No prevertebral fluid or swelling. No visible canal hematoma. Disc levels:  No CT evidence of high-grade spinal canal stenosis. Upper chest: Negative Other: None IMPRESSION: 1. No acute intracranial abnormality. 2. No acute fracture or traumatic malalignment of the cervical spine. 3. Moderate right-sided mastoid effusion and middle ear effusion. Correlate with physical exam for signs of otomastoiditis. Electronically Signed   By: Lorenza Cambridge M.D.   On: 12/16/2022 16:48   CT Cervical Spine Wo Contrast  Result Date: 12/16/2022 CLINICAL DATA:  Headache, new onset (Age >= 51y); Neck pain, chronic with worsening headache EXAM: CT HEAD WITHOUT CONTRAST CT CERVICAL SPINE WITHOUT CONTRAST TECHNIQUE: Multidetector CT imaging of the head and cervical spine was performed following the standard protocol without intravenous contrast. Multiplanar CT image reconstructions of the cervical spine were also generated. RADIATION DOSE REDUCTION: This exam was performed according to the departmental dose-optimization program which includes automated exposure control, adjustment of the mA and/or kV according to patient size and/or use of iterative reconstruction technique. COMPARISON:  None Available. FINDINGS: CT HEAD FINDINGS Brain: No hemorrhage. No hydrocephalus. No extra-axial fluid collection. No CT evidence of an acute cortical infarct. Mass effect. No mass lesion. There is sequela of mild chronic  microvascular ischemic change. Vascular: No hyperdense vessel or unexpected calcification. Skull: Normal. Negative for fracture or focal lesion. Sinuses/Orbits: Moderate right-sided mastoid effusion and middle ear effusion. No left-sided mastoid or middle ear effusion. Paranasal sinuses are clear. Bilateral lens replacement. Orbits are otherwise unremarkable. Other: None. CT CERVICAL SPINE FINDINGS Alignment: Grade  1 anterolisthesis of C4 on C5. Skull base and vertebrae: No acute fracture. No primary bone lesion or focal pathologic process. Soft tissues and spinal canal: No prevertebral fluid or swelling. No visible canal hematoma. Disc levels:  No CT evidence of high-grade spinal canal stenosis. Upper chest: Negative Other: None IMPRESSION: 1. No acute intracranial abnormality. 2. No acute fracture or traumatic malalignment of the cervical spine. 3. Moderate right-sided mastoid effusion and middle ear effusion. Correlate with physical exam for signs of otomastoiditis. Electronically Signed   By: Lorenza Cambridge M.D.   On: 12/16/2022 16:48    Pending Labs Unresulted Labs (From admission, onward)     Start     Ordered   12/23/22 0500  Creatinine, serum  (enoxaparin (LOVENOX)    CrCl >/= 30 ml/min)  Weekly,   R     Comments: while on enoxaparin therapy    12/16/22 2112   12/17/22 0500  APTT  Tomorrow morning,   R        12/16/22 2112   12/17/22 0500  Protime-INR  Tomorrow morning,   R        12/16/22 2112   12/17/22 0500  Cortisol  Tomorrow morning,   R        12/16/22 2137   12/17/22 0500  Sedimentation rate  Tomorrow morning,   R        12/16/22 2147   12/16/22 2111  CBC  (enoxaparin (LOVENOX)    CrCl >/= 30 ml/min)  Once,   R       Comments: Baseline for enoxaparin therapy IF NOT ALREADY DRAWN.  Notify MD if PLT < 100 K.    12/16/22 2112   12/16/22 2111  Creatinine, serum  (enoxaparin (LOVENOX)    CrCl >/= 30 ml/min)  Once,   R       Comments: Baseline for enoxaparin therapy IF NOT ALREADY  DRAWN.    12/16/22 2112   12/16/22 2110  Sodium, urine, random  ONCE - URGENT,   URGENT        12/16/22 2109            Vitals/Pain Today's Vitals   12/16/22 1900 12/16/22 2100 12/16/22 2144 12/16/22 2145  BP: 125/66 122/86  135/86  Pulse: 97 81  86  Resp: 17 12  (!) 21  Temp:    97.9 F (36.6 C)  TempSrc:    Oral  SpO2: 97% 96%  96%  Weight:      Height:      PainSc:   4      Isolation Precautions Airborne and Contact precautions  Medications Medications  diltiazem (CARDIZEM CD) 24 hr capsule 180 mg (180 mg Oral Given 12/16/22 1843)  acetaminophen (TYLENOL) tablet 650 mg (has no administration in time range)    Or  acetaminophen (TYLENOL) suppository 650 mg (has no administration in time range)  polyethylene glycol (MIRALAX / GLYCOLAX) packet 17 g (has no administration in time range)  sodium chloride flush (NS) 0.9 % injection 3 mL (has no administration in time range)  HYDROcodone-acetaminophen (NORCO/VICODIN) 5-325 MG per tablet 0.5 tablet (has no administration in time range)  arformoterol (BROVANA) nebulizer solution 15 mcg (has no administration in time range)    And  umeclidinium bromide (INCRUSE ELLIPTA) 62.5 MCG/ACT 1 puff (has no administration in time range)  apixaban (ELIQUIS) tablet 2.5 mg (has no administration in time range)  atorvastatin (LIPITOR) tablet 10 mg (has no administration in time range)  latanoprost (XALATAN) 0.005 %  ophthalmic solution 1 drop (has no administration in time range)  levothyroxine (SYNTHROID) tablet 50 mcg (has no administration in time range)  pantoprazole (PROTONIX) EC tablet 40 mg (has no administration in time range)  lactated ringers bolus 1,000 mL (0 mLs Intravenous Stopped 12/16/22 1358)  ondansetron (ZOFRAN) injection 4 mg (4 mg Intravenous Given 12/16/22 1240)  prochlorperazine (COMPAZINE) injection 10 mg (10 mg Intravenous Given 12/16/22 1403)  diphenhydrAMINE (BENADRYL) injection 12.5 mg (12.5 mg Intravenous Given  12/16/22 1403)  sodium chloride 0.9 % bolus 1,000 mL (0 mLs Intravenous Stopped 12/16/22 1446)    Mobility walks     Focused Assessments Cardiac Assessment Handoff:  Cardiac Rhythm: Normal sinus rhythm No results found for: "CKTOTAL", "CKMB", "CKMBINDEX", "TROPONINI" Lab Results  Component Value Date   DDIMER 0.66 (H) 07/26/2019   Does the Patient currently have chest pain? No   , Neuro Assessment Handoff:  Swallow screen pass? Yes  Cardiac Rhythm: Normal sinus rhythm       Neuro Assessment: Within Defined Limits (headache) Neuro Checks:      Has TPA been given? No If patient is a Neuro Trauma and patient is going to OR before floor call report to 4N Charge nurse: [phone] or [phone]  , Pulmonary Assessment Handoff:  Lung sounds:   O2 Device: Room Air      R Recommendations: See Admitting Provider Note  Report given to:   Additional Notes:

## 2022-12-16 NOTE — Assessment & Plan Note (Signed)
This is likely due to patient's acute infection/COVID-19, although typically COVID-19 is associated with lymphopenia, at this time I will trend this.  Check B12 and folate levels.

## 2022-12-16 NOTE — Plan of Care (Signed)

## 2022-12-16 NOTE — Assessment & Plan Note (Signed)
C/w eliquis and diltiazem

## 2022-12-16 NOTE — H&P (Signed)
History and Physical    Patient: Jill Jackson ZOX:[id number] DOB: [birth year] DOA: 12/16/2022 DOS: the patient was seen and examined on 12/16/2022 PCP: Kaleen Mask, MD  Patient coming from: Home  Chief Complaint:  Chief Complaint  Patient presents with   Headache   HPI: Sherion Keckler is a 82 y.o. female with medical history significant of chronic atrial fibrillation for which patient is on anticoagulation, patient also reports having osteoarthritis for which she occasionally requires pain medications.  Patient was in her usual state of health till approximately 4 days ago when patient reports simultaneous onset of syndrome of nausea, poor appetite occasional vomiting with ingestion of food as well as a mild to moderate headache all over her head without any particular aggravating or relieving factor.  Patient did not have any fever, cough, shortness of breath, diarrhea.  Patient has basically been drinking clear fluids as much as she can tolerate over the last few days.  And has gotten progressively weaker, although patient does not report any fall or loss of consciousness, this prompted the patient to come to the ER.  There are no vision changes bladder or bowel changes or any focal weakness  ER course is notable for patient having had trouble tolerating diet, found to have moderate hyponatremia.  Medical evaluation is sought Review of Systems: As mentioned in the history of present illness. All other systems reviewed and are negative. Past Medical History:  Diagnosis Date   Atrial fibrillation (HCC) 11/01/2018   HLD (hyperlipidemia) 11/01/2018   Hypertension    Past Surgical History:  Procedure Laterality Date   TUBAL LIGATION  1977   Social History:  reports that she has been smoking cigarettes. She has a 7.5 pack-year smoking history. She has been exposed to tobacco smoke. She has never used smokeless tobacco. She reports that she does not currently use  alcohol. She reports that she does not currently use drugs.  Allergies  Allergen Reactions   Tetracyclines & Related Nausea And Vomiting    Family History  Problem Relation Age of Onset   Stroke Mother    Kidney failure Sister    Hypertension Sister    Thyroid disease Sister    Stroke Brother    Hypertension Brother     Prior to Admission medications   Medication Sig Start Date End Date Taking? Authorizing Provider  acetaminophen (TYLENOL) 650 MG CR tablet Take 650 mg by mouth 2 (two) times daily as needed for pain.   Yes [provider]  apixaban (ELIQUIS) 2.5 MG TABS tablet Take 1 tablet (2.5 mg total) by mouth 2 (two) times daily. 12/14/22  Yes Patwardhan, Manish J, MD  atorvastatin (LIPITOR) 10 MG tablet TAKE 1 TABLET(10 MG) BY MOUTH DAILY Patient taking differently: Take 10 mg by mouth See admin instructions. Take 10 mg once daily around noon. 11/18/22  Yes Patwardhan, Manish J, MD  Cholecalciferol (VITAMIN D-3 PO) Take 1 capsule by mouth daily at 12 noon.   Yes [provider]  diltiazem (CARDIZEM) 30 MG tablet Take 1 tablet (30 mg total) by mouth 3 (three) times daily as needed. 12/07/22  Yes Patwardhan, Manish J, MD  diltiazem (TIAZAC) 180 MG 24 hr capsule Take 1 capsule (180 mg total) by mouth daily at 12 noon. 12/07/22  Yes Patwardhan, Anabel Bene, MD  HYDROcodone-acetaminophen (NORCO/VICODIN) 5-325 MG tablet Take 0.5 tablets by mouth 2 (two) times daily as needed for severe pain. 08/05/19  Yes [provider]  latanoprost (XALATAN) 0.005 %  ophthalmic solution Place 1 drop into both eyes at bedtime.   Yes [provider]  levothyroxine (SYNTHROID) 50 MCG tablet Take 50 mcg by mouth daily before breakfast. 03/03/20  Yes [provider]  nitroGLYCERIN (NITROSTAT) 0.4 MG SL tablet Place 1 tablet (0.4 mg total) under the tongue every 5 (five) minutes as needed for up to 25 days for chest pain. 07/30/19 02/11/23 Yes Yates Decamp, MD  OVER THE COUNTER  MEDICATION Take 1 capsule by mouth at bedtime. Relaxium Sleep   Yes [provider]  pantoprazole (PROTONIX) 40 MG tablet Take 40 mg by mouth daily at 12 noon. 10/11/19  Yes [provider]  psyllium (METAMUCIL) 58.6 % packet Take 1 packet by mouth daily as needed (stomach issues).   Yes [provider]  senna (SENOKOT) 8.6 MG TABS tablet Take 8.6 mg by mouth at bedtime.   Yes [provider]  spironolactone (ALDACTONE) 25 MG tablet Take 1 tablet (25 mg total) by mouth daily. Patient taking differently: Take 25 mg by mouth See admin instructions. Take 25 mg once daily around noon. 12/07/22  Yes Patwardhan, Manish J, MD  Tiotropium Bromide-Olodaterol 2.5-2.5 MCG/ACT AERS Inhale 2 puffs into the lungs daily. Patient taking differently: Inhale 2 puffs into the lungs See admin instructions. Inhale 2 puffs once daily around noon. 12/27/21  Yes Luciano Cutter, MD    Physical Exam: Vitals:   12/16/22 1800 12/16/22 1842 12/16/22 1900 12/16/22 2100  BP: 129/73 116/71 125/66 122/86  Pulse: 89 89 97 81  Resp: 19 17 17 12   Temp:      TempSrc:      SpO2: 97% 97% 97% 96%  Weight:      Height:       General: Thin lady, appears to be no immediate distress on room air, gives a detailed account of her symptoms.  Pleasant Respiratory exam: Bilateral intravesicular Cardiovascular exam S1-S2 normal Abdomen all quadrants are soft nontender Extremities warm without edema no focal motor weakness is noted Symmetric facies, no focal tenderness of the scalp. Data Reviewed:  Labs on Admission:  Results for orders placed or performed during the hospital encounter of 12/16/22 (from the past 24 hour(s))  Basic metabolic panel     Status: Abnormal   Collection Time: 12/16/22 12:40 PM  Result Value Ref Range   Sodium 122 (L) 135 - 145 mmol/L   Potassium 4.6 3.5 - 5.1 mmol/L   Chloride 91 (L) 98 - 111 mmol/L   CO2 22 22 - 32 mmol/L   Glucose, Bld 91 70 - 99 mg/dL   BUN 17 8 -  23 mg/dL   Creatinine, Ser 1.61 (H) 0.44 - 1.00 mg/dL   Calcium 8.6 (L) 8.9 - 10.3 mg/dL   GFR, Estimated 55 (L) >60 mL/min   Anion gap 9 5 - 15  CBC with Differential     Status: Abnormal   Collection Time: 12/16/22 12:40 PM  Result Value Ref Range   WBC 2.7 (L) 4.0 - 10.5 K/uL   RBC 3.94 3.87 - 5.11 MIL/uL   Hemoglobin 13.3 12.0 - 15.0 g/dL   HCT 09.6 04.5 - 40.9 %   MCV 93.1 80.0 - 100.0 fL   MCH 33.8 26.0 - 34.0 pg   MCHC 36.2 (H) 30.0 - 36.0 g/dL   RDW 81.1 91.4 - 78.2 %   Platelets 222 150 - 400 K/uL   nRBC 0.0 0.0 - 0.2 %   Neutrophils Relative % 58 %  Neutro Abs 1.6 (L) 1.7 - 7.7 K/uL   Lymphocytes Relative 25 %   Lymphs Abs 0.7 0.7 - 4.0 K/uL   Monocytes Relative 17 %   Monocytes Absolute 0.5 0.1 - 1.0 K/uL   Eosinophils Relative 0 %   Eosinophils Absolute 0.0 0.0 - 0.5 K/uL   Basophils Relative 0 %   Basophils Absolute 0.0 0.0 - 0.1 K/uL   Immature Granulocytes 0 %   Abs Immature Granulocytes 0.00 0.00 - 0.07 K/uL  Resp panel by RT-PCR (RSV, Flu A&B, Covid) Anterior Nasal Swab     Status: Abnormal   Collection Time: 12/16/22 12:40 PM   Specimen: Anterior Nasal Swab  Result Value Ref Range   SARS Coronavirus 2 by RT PCR POSITIVE (A) NEGATIVE   Influenza A by PCR NEGATIVE NEGATIVE   Influenza B by PCR NEGATIVE NEGATIVE   Resp Syncytial Virus by PCR NEGATIVE NEGATIVE  I-stat chem 8, ED (not at [facility], [facility] or [facility])     Status: Abnormal   Collection Time: 12/16/22  5:12 PM  Result Value Ref Range   Sodium 133 (L) 135 - 145 mmol/L   Potassium 4.2 3.5 - 5.1 mmol/L   Chloride 102 98 - 111 mmol/L   BUN 14 8 - 23 mg/dL   Creatinine, Ser 3.24 0.44 - 1.00 mg/dL   Glucose, Bld 78 70 - 99 mg/dL   Calcium, Ion 4.01 (L) 1.15 - 1.40 mmol/L   TCO2 18 (L) 22 - 32 mmol/L   Hemoglobin 11.6 (L) 12.0 - 15.0 g/dL   HCT 02.7 (L) 25.3 - 66.4 %  Basic metabolic panel     Status: Abnormal   Collection Time: 12/16/22  5:29 PM  Result Value Ref Range   Sodium 128 (L) 135 - 145 mmol/L    Potassium 4.7 3.5 - 5.1 mmol/L   Chloride 97 (L) 98 - 111 mmol/L   CO2 20 (L) 22 - 32 mmol/L   Glucose, Bld 89 70 - 99 mg/dL   BUN 15 8 - 23 mg/dL   Creatinine, Ser 4.03 0.44 - 1.00 mg/dL   Calcium 8.5 (L) 8.9 - 10.3 mg/dL   GFR, Estimated 58 (L) >60 mL/min   Anion gap 11 5 - 15   Basic Metabolic Panel: Recent Labs  Lab 12/16/22 1240 12/16/22 1712 12/16/22 1729  NA 122* 133* 128*  K 4.6 4.2 4.7  CL 91* 102 97*  CO2 22  --  20*  GLUCOSE 91 78 89  BUN 17 14 15   CREATININE 1.01* 0.70 0.97  CALCIUM 8.6*  --  8.5*   Liver Function Tests: No results for input(s): "AST", "ALT", "ALKPHOS", "BILITOT", "PROT", "ALBUMIN" in the last 168 hours. No results for input(s): "LIPASE", "AMYLASE" in the last 168 hours. No results for input(s): "AMMONIA" in the last 168 hours. CBC: Recent Labs  Lab 12/16/22 1240 12/16/22 1712  WBC 2.7*  --   NEUTROABS 1.6*  --   HGB 13.3 11.6*  HCT 36.7 34.0*  MCV 93.1  --   PLT 222  --    Cardiac Enzymes: No results for input(s): "CKTOTAL", "CKMB", "CKMBINDEX", "TROPONINIHS" in the last 168 hours.  BNP (last 3 results) No results for input(s): "PROBNP" in the last 8760 hours. CBG: No results for input(s): "GLUCAP" in the last 168 hours.  Radiological Exams on Admission:  CT Head Wo Contrast  Result Date: 12/16/2022 CLINICAL DATA:  Headache, new onset (Age >= 51y); Neck pain, chronic with worsening headache EXAM: CT HEAD  WITHOUT CONTRAST CT CERVICAL SPINE WITHOUT CONTRAST TECHNIQUE: Multidetector CT imaging of the head and cervical spine was performed following the standard protocol without intravenous contrast. Multiplanar CT image reconstructions of the cervical spine were also generated. RADIATION DOSE REDUCTION: This exam was performed according to the departmental dose-optimization program which includes automated exposure control, adjustment of the mA and/or kV according to patient size and/or use of iterative reconstruction technique.  COMPARISON:  None Available. FINDINGS: CT HEAD FINDINGS Brain: No hemorrhage. No hydrocephalus. No extra-axial fluid collection. No CT evidence of an acute cortical infarct. Mass effect. No mass lesion. There is sequela of mild chronic microvascular ischemic change. Vascular: No hyperdense vessel or unexpected calcification. Skull: Normal. Negative for fracture or focal lesion. Sinuses/Orbits: Moderate right-sided mastoid effusion and middle ear effusion. No left-sided mastoid or middle ear effusion. Paranasal sinuses are clear. Bilateral lens replacement. Orbits are otherwise unremarkable. Other: None. CT CERVICAL SPINE FINDINGS Alignment: Grade 1 anterolisthesis of C4 on C5. Skull base and vertebrae: No acute fracture. No primary bone lesion or focal pathologic process. Soft tissues and spinal canal: No prevertebral fluid or swelling. No visible canal hematoma. Disc levels:  No CT evidence of high-grade spinal canal stenosis. Upper chest: Negative Other: None IMPRESSION: 1. No acute intracranial abnormality. 2. No acute fracture or traumatic malalignment of the cervical spine. 3. Moderate right-sided mastoid effusion and middle ear effusion. Correlate with physical exam for signs of otomastoiditis. Electronically Signed   By: Lorenza Cambridge M.D.   On: 12/16/2022 16:48   CT Cervical Spine Wo Contrast  Result Date: 12/16/2022 CLINICAL DATA:  Headache, new onset (Age >= 51y); Neck pain, chronic with worsening headache EXAM: CT HEAD WITHOUT CONTRAST CT CERVICAL SPINE WITHOUT CONTRAST TECHNIQUE: Multidetector CT imaging of the head and cervical spine was performed following the standard protocol without intravenous contrast. Multiplanar CT image reconstructions of the cervical spine were also generated. RADIATION DOSE REDUCTION: This exam was performed according to the departmental dose-optimization program which includes automated exposure control, adjustment of the mA and/or kV according to patient size and/or use  of iterative reconstruction technique. COMPARISON:  None Available. FINDINGS: CT HEAD FINDINGS Brain: No hemorrhage. No hydrocephalus. No extra-axial fluid collection. No CT evidence of an acute cortical infarct. Mass effect. No mass lesion. There is sequela of mild chronic microvascular ischemic change. Vascular: No hyperdense vessel or unexpected calcification. Skull: Normal. Negative for fracture or focal lesion. Sinuses/Orbits: Moderate right-sided mastoid effusion and middle ear effusion. No left-sided mastoid or middle ear effusion. Paranasal sinuses are clear. Bilateral lens replacement. Orbits are otherwise unremarkable. Other: None. CT CERVICAL SPINE FINDINGS Alignment: Grade 1 anterolisthesis of C4 on C5. Skull base and vertebrae: No acute fracture. No primary bone lesion or focal pathologic process. Soft tissues and spinal canal: No prevertebral fluid or swelling. No visible canal hematoma. Disc levels:  No CT evidence of high-grade spinal canal stenosis. Upper chest: Negative Other: None IMPRESSION: 1. No acute intracranial abnormality. 2. No acute fracture or traumatic malalignment of the cervical spine. 3. Moderate right-sided mastoid effusion and middle ear effusion. Correlate with physical exam for signs of otomastoiditis. Electronically Signed   By: Lorenza Cambridge M.D.   On: 12/16/2022 16:48    EKG: Independently reviewed.    Assessment and Plan: * Hyponatremia Given the history as well as response of the patient to fluid bolus given in the ER this is highly concerning for prerenal/hypovolemic hyponatremia that has improved from 1 22-1 28.  At this time I will  hold off on further fluids, check TSH cortisol, give regular diet and trend.  Restart spironolactone once acute illness resolves  COVID-19 virus infection Patient is unvaccinated, has atypical symptoms in terms of not having any diarrhea or any respiratory symptoms at presentation.  However I do feel that this was likely an acute  illness.  We will get a chest x-ray, given patient's prominent complaint of reduced appetite and vomiting, I will get x-ray of the abdomen LS as well.  Given her diagnosis of COPD, and unvaccinated status I do feel patient is at moderate to elevated risk of progression.  I offered her Paxlovid initially, however it was contraindicated with apixaban use.  Therefore I offered her remdesivir, however patient declined remdesivir due to concerns of nephrotoxicity.  We will have to monitor the patient clinically at this time.  Follow-up chest x-ray and abdominal x-ray  Associated with mild headache, that has been going on for 3 days, at this time CT head nonactionable.  I will check a sed rate, however if headache resolves, I do not think further workup is indicated.  Neutropenia (HCC) This is likely due to patient's acute infection/COVID-19, although typically COVID-19 is associated with lymphopenia, at this time I will trend this.  Check B12 and folate levels.  Atrial fibrillation (HCC) C/w eliquis and diltiazem   I anticipate if patient's lab markers are looking better by tomorrow morning and if she tolerates physical therapy evaluation and diet, she can go home tomorrow   Advance Care Planning: Patient advised me that she wishes to be DNR.  However she is okay with intubation if her family consents.  I offered to call patient's family to discuss the DNR as well, however patient declined that and she was pretty firm on that.  RN was present in the room during this discussion  Consults: I appreciate pharmacy help in comanagement  Family Communication: Patient will communicate with family.  Severity of Illness: The appropriate patient status for this patient is OBSERVATION. Observation status is judged to be reasonable and necessary in order to provide the required intensity of service to ensure the patient's safety. The patient's presenting symptoms, physical exam findings, and initial radiographic  and laboratory data in the context of their medical condition is felt to place them at decreased risk for further clinical deterioration. Furthermore, it is anticipated that the patient will be medically stable for discharge from the hospital within 2 midnights of admission.   Author: Nolberto Hanlon, MD 12/16/2022 9:44 PM  For on call review [email or web address].

## 2022-12-16 NOTE — ED Provider Notes (Signed)
[facility] AT [facility] Provider Note   CSN: [id number] Arrival date & time: 12/16/22  1147     History  Chief Complaint  Patient presents with   Headache    Jill Jackson is a 82 y.o. female with past medical history significant for hyperlipidemia, atrial fibrillation, hypertension, chronic anticoagulation with Eliquis presents to the ED complaining of headache and neck pain for the past 3 days.  She states she has had headaches and migraines in the past, but never for this long.  Endorses mild photophobia, generalized weakness, nausea and vomiting with the headache.  Denies unilateral weakness, vision changes, abdominal pain, fever, chills, head injury, syncope, light-headedness, dizziness, difficulty walking, neck stiffness.          Home Medications Prior to Admission medications   Medication Sig Start Date End Date Taking? Authorizing Provider  apixaban (ELIQUIS) 2.5 MG TABS tablet Take 1 tablet (2.5 mg total) by mouth 2 (two) times daily. 12/14/22   Patwardhan, Anabel Bene, MD  atorvastatin (LIPITOR) 10 MG tablet TAKE 1 TABLET(10 MG) BY MOUTH DAILY 11/18/22   Patwardhan, Manish J, MD  calcium carbonate (TUMS - DOSED IN MG ELEMENTAL CALCIUM) 500 MG chewable tablet Chew 1 tablet by mouth daily.    [provider]  diltiazem (CARDIZEM) 30 MG tablet Take 1 tablet (30 mg total) by mouth 3 (three) times daily as needed. 12/07/22   Patwardhan, Anabel Bene, MD  diltiazem (TIAZAC) 180 MG 24 hr capsule Take 1 capsule (180 mg total) by mouth daily at 12 noon. 12/07/22   Patwardhan, Manish J, MD  diphenhydrAMINE-APAP, sleep, (TYLENOL PM EXTRA STRENGTH PO) Take 0.5 tablets by mouth at bedtime as needed (Sleep).    [provider]  HYDROcodone-acetaminophen (NORCO/VICODIN) 5-325 MG tablet Take 1 tablet by mouth every 4 (four) hours as needed for moderate pain.  08/05/19   [provider]  levothyroxine (SYNTHROID) 50 MCG tablet Take 1  tablet by mouth daily. 03/03/20   [provider]  melatonin 1 MG TABS tablet Take 3-5 mg by mouth at bedtime as needed (for sleep).    [provider]  nitroGLYCERIN (NITROSTAT) 0.4 MG SL tablet Place 1 tablet (0.4 mg total) under the tongue every 5 (five) minutes as needed for up to 25 days for chest pain. 07/30/19 12/07/22  Yates Decamp, MD  pantoprazole (PROTONIX) 40 MG tablet Take 40 mg by mouth daily before breakfast. 10/11/19   [provider]  psyllium (METAMUCIL) 58.6 % packet Take 1 packet by mouth daily.    [provider]  spironolactone (ALDACTONE) 25 MG tablet Take 1 tablet (25 mg total) by mouth daily. 12/07/22   Patwardhan, Anabel Bene, MD  Tiotropium Bromide-Olodaterol 2.5-2.5 MCG/ACT AERS Inhale 2 puffs into the lungs daily. 12/27/21   Luciano Cutter, MD      Allergies    Tetracyclines & related    Review of Systems   Review of Systems  Eyes:  Positive for photophobia. Negative for visual disturbance.  Gastrointestinal:  Positive for nausea and vomiting. Negative for abdominal pain.  Musculoskeletal:  Positive for neck pain. Negative for gait problem and neck stiffness.  Neurological:  Positive for weakness (generalized) and headaches. Negative for dizziness, syncope and light-headedness.    Physical Exam Updated Vital Signs BP 125/71   Pulse 63   Temp 98.2 F (36.8 C) (Oral)   Resp 17   Ht 5\' 5"  (1.651 m)   Wt 58 kg   SpO2  97%   BMI 21.28 kg/m  Physical Exam Vitals and nursing note reviewed.  Constitutional:      General: She is not in acute distress.    Appearance: Normal appearance. She is not ill-appearing or diaphoretic.  Cardiovascular:     Rate and Rhythm: Normal rate and regular rhythm.  Pulmonary:     Effort: Pulmonary effort is normal.  Musculoskeletal:     Cervical back: Full passive range of motion without pain. No spinous process tenderness or muscular tenderness.  Skin:    General: Skin is warm and dry.      Capillary Refill: Capillary refill takes less than 2 seconds.  Neurological:     General: No focal deficit present.     Mental Status: She is alert and oriented to person, place, and time. Mental status is at baseline.     GCS: GCS eye subscore is 4. GCS verbal subscore is 5. GCS motor subscore is 6.     Cranial Nerves: Cranial nerves 2-12 are intact.     Sensory: Sensation is intact.     Motor: Motor function is intact.     Coordination: Coordination is intact.  Psychiatric:        Mood and Affect: Mood normal.        Behavior: Behavior normal.     ED Results / Procedures / Treatments   Labs (all labs ordered are listed, but only abnormal results are displayed) Labs Reviewed  RESP PANEL BY RT-PCR (RSV, FLU A&B, COVID)  RVPGX2 - Abnormal; Notable for the following components:      Result Value   SARS Coronavirus 2 by RT PCR POSITIVE (*)    All other components within normal limits  BASIC METABOLIC PANEL - Abnormal; Notable for the following components:   Sodium 122 (*)    Chloride 91 (*)    Creatinine, Ser 1.01 (*)    Calcium 8.6 (*)    GFR, Estimated 55 (*)    All other components within normal limits  CBC WITH DIFFERENTIAL/PLATELET - Abnormal; Notable for the following components:   WBC 2.7 (*)    MCHC 36.2 (*)    Neutro Abs 1.6 (*)    All other components within normal limits    EKG None  Radiology No results found.  Procedures Procedures    Medications Ordered in ED Medications  lactated ringers bolus 1,000 mL (0 mLs Intravenous Stopped 12/16/22 1358)  ondansetron (ZOFRAN) injection 4 mg (4 mg Intravenous Given 12/16/22 1240)  prochlorperazine (COMPAZINE) injection 10 mg (10 mg Intravenous Given 12/16/22 1403)  diphenhydrAMINE (BENADRYL) injection 12.5 mg (12.5 mg Intravenous Given 12/16/22 1403)  sodium chloride 0.9 % bolus 1,000 mL (0 mLs Intravenous Stopped 12/16/22 1446)    ED Course/ Medical Decision Making/ A&P Clinical Course as of 12/16/22 1535  Fri  Dec 16, 2022  1426 Sodium(!): 122 [MC]  1426 SARS Coronavirus 2 by RT PCR(!): POSITIVE [MC]  1426 WBC(!): 2.7 [MC]  1520 3 days of headache with nausea and vomiting, on spironolactone. New hyponatremia. - F/u CT scans [KH]    Clinical Course User Index [KH] Claretha Cooper, DO [MC] Lenard Simmer, PA-C                                 Medical Decision Making Amount and/or Complexity of Data Reviewed Labs: ordered. Radiology: ordered.  Risk Prescription drug management.   This patient presents to the ED  with chief complaint(s) of headache, neck pain, nausea, vomiting with pertinent past medical history of hyperlipidemia, Afib, Eliquis use.  The complaint involves an extensive differential diagnosis and also carries with it a high risk of complications and morbidity.    The differential diagnosis includes acute intracranial abnormality, migraine headache, tension headache, metabolic derangement, COVID, influenza, other infectious process   The initial plan is to obtain labs, CT head/c-spine  Additional history obtained: Additional history obtained from EMS  - EMS report patient was ambulatory on scene with steady gait  Initial Assessment:   On exam, patient is resting comfortably in bed and does not appear to be in acute distress.  EOM intact without abnormality.  Vision grossly intact.  Normal ROM of neck without pain.  5/5 strength in BUE and BLE with equal grip.  Sensation grossly intact.  Skin is warm and dry.  Abdomen is soft and non tender.    Independent ECG/labs interpretation:  The following labs were independently interpreted:  CBC with leukopenia, no anemia.  Metabolic panel with hyponatremia and mildly elevated Cr.  COVID+.    Treatment and Reassessment: Patient given IV fluids and IV Zofran with improvement in symptoms.  Will give compazine and benadryl for acute headache treatment.  Additional fluid bolus of NS ordered for hyponatremia.   Patient reports feeling a  "little jumpy" after compazine and benadryl.  No evidence of tardive dyskinesia.    Disposition:   3:35 PM Care transferred to Dr. Harle Battiest and Dr. Melene Plan at the end of my shift as the patient will require reassessment once labs/imaging have resulted. Patient presentation, ED course, and plan of care discussed with review of all pertinent labs and imaging. Please see his/her note for further details regarding further ED course and disposition. Plan at time of handoff is follow up on CT results and admit patient to hospital for COVID and hyponatremia. This may be altered or completely changed at the discretion of the oncoming team pending results of further workup.          Final Clinical Impression(s) / ED Diagnoses Final diagnoses:  COVID  Hyponatremia  Bad headache  Generalized weakness    Rx / DC Orders ED Discharge Orders     None         Lenard Simmer, PA-C 12/16/22 1535    Kommor, Frederick, MD 12/17/22 1316

## 2022-12-17 DIAGNOSIS — D709 Neutropenia, unspecified: Secondary | ICD-10-CM | POA: Diagnosis not present

## 2022-12-17 DIAGNOSIS — E871 Hypo-osmolality and hyponatremia: Secondary | ICD-10-CM | POA: Diagnosis not present

## 2022-12-17 DIAGNOSIS — U071 COVID-19: Secondary | ICD-10-CM | POA: Diagnosis not present

## 2022-12-17 DIAGNOSIS — I480 Paroxysmal atrial fibrillation: Secondary | ICD-10-CM

## 2022-12-17 LAB — BASIC METABOLIC PANEL
Anion gap: 10 (ref 5–15)
BUN: 16 mg/dL (ref 8–23)
CO2: 19 mmol/L — ABNORMAL LOW (ref 22–32)
Calcium: 8.3 mg/dL — ABNORMAL LOW (ref 8.9–10.3)
Chloride: 102 mmol/L (ref 98–111)
Creatinine, Ser: 0.95 mg/dL (ref 0.44–1.00)
GFR, Estimated: 59 mL/min — ABNORMAL LOW (ref >60)
Glucose, Bld: 83 mg/dL (ref 70–99)
Potassium: 4.4 mmol/L (ref 3.5–5.1)
Sodium: 131 mmol/L — ABNORMAL LOW (ref 135–145)

## 2022-12-17 LAB — CBC
HCT: 36.8 % (ref 36.0–46.0)
Hemoglobin: 13.2 g/dL (ref 12.0–15.0)
MCH: 32.4 pg (ref 26.0–34.0)
MCHC: 35.9 g/dL (ref 30.0–36.0)
MCV: 90.4 fL (ref 80.0–100.0)
Platelets: 241 10*3/uL (ref 150–400)
RBC: 4.07 MIL/uL (ref 3.87–5.11)
RDW: 12.1 % (ref 11.5–15.5)
WBC: 3.3 10*3/uL — ABNORMAL LOW (ref 4.0–10.5)
nRBC: 0 % (ref 0.0–0.2)

## 2022-12-17 LAB — HEPATIC FUNCTION PANEL
ALT: 22 U/L (ref 0–44)
AST: 26 U/L (ref 15–41)
Albumin: 3.4 g/dL — ABNORMAL LOW (ref 3.5–5.0)
Alkaline Phosphatase: 81 U/L (ref 38–126)
Bilirubin, Direct: <0.1 mg/dL (ref 0.0–0.2)
Total Bilirubin: 0.7 mg/dL (ref 0.3–1.2)
Total Protein: 6.4 g/dL — ABNORMAL LOW (ref 6.5–8.1)

## 2022-12-17 LAB — PROTIME-INR
INR: 1.2 (ref 0.8–1.2)
Prothrombin Time: 15.7 s — ABNORMAL HIGH (ref 11.4–15.2)

## 2022-12-17 LAB — APTT: aPTT: 30 s (ref 24–36)

## 2022-12-17 LAB — TSH: TSH: 3.368 u[IU]/mL (ref 0.350–4.500)

## 2022-12-17 LAB — CORTISOL: Cortisol, Plasma: 20.2 ug/dL

## 2022-12-17 LAB — SEDIMENTATION RATE: Sed Rate: 11 mm/h (ref 0–22)

## 2022-12-17 LAB — VITAMIN B12: Vitamin B-12: 474 pg/mL (ref 180–914)

## 2022-12-17 LAB — FOLATE: Folate: 24.6 ng/mL (ref >5.9)

## 2022-12-17 NOTE — Evaluation (Signed)
Physical Therapy Evaluation Patient Details Name: Jill Jackson MRN: [id number] DOB: Dec 20, 1940 Today's Date: 12/17/2022  History of Present Illness  Patient is 82 y.o. female presented to ED after four days of nausea, poor appetite occasional vomiting with ingestion of food as well as a mild to moderate headache all over her head without any particular aggravating or relieving factor. PMH significant for Afib, HTN, HLD. In ED pt found to be + for COVID-19.   Clinical Impression  Jill Jackson is 82 y.o. female admitted with above HPI and diagnosis. Patient is currently limited by functional impairments below (see PT problem list). Patient lives with grandchildren and is independent at baseline. Currently pt limited by slight dizziness with mobilizing and unsteady with gait without UE support. Balance improved with RW and pt educated on benefits of AD for safety. Patient will benefit from continued skilled PT interventions to address impairments and progress independence with mobility. Acute PT will follow and progress as able.         If plan is discharge home, recommend the following: A little help with walking and/or transfers;A little help with bathing/dressing/bathroom;Help with stairs or ramp for entrance;Assist for transportation;Assistance with cooking/housework   Can travel by private Tax inspector (2 wheels)  Recommendations for Other Services       Functional Status Assessment Patient has had a recent decline in their functional status and demonstrates the ability to make significant improvements in function in a reasonable and predictable amount of time.     Precautions / Restrictions Precautions Precautions: Fall Restrictions Weight Bearing Restrictions: No      Mobility  Bed Mobility Overal bed mobility: Needs Assistance Bed Mobility: Supine to Sit     Supine to sit: Supervision, Used rails           Transfers Overall transfer level: Needs assistance Equipment used: None, Rolling walker (2 wheels) Transfers: Sit to/from Stand Sit to Stand: Contact guard assist           General transfer comment: CGA for safety, pt reliant on UE for power up and controlled lowering    Ambulation/Gait Ambulation/Gait assistance: Contact guard assist Gait Distance (Feet): 40 Feet (20, 40) Assistive device: None, Rolling walker (2 wheels) Gait Pattern/deviations: Step-through pattern, Decreased stride length, Trunk flexed Gait velocity: decr     General Gait Details: min assist with no AD and pt reaching out for support of furniture/walls/counters. balance improved with RW for bil UE support with CGA only. pt HR in Afib fluctuating throughout.   Stairs            Wheelchair Mobility     Tilt Bed    Modified Rankin (Stroke Patients Only)       Balance Overall balance assessment: Needs assistance Sitting-balance support: Feet supported Sitting balance-Leahy Scale: Good     Standing balance support: During functional activity, No upper extremity supported, Bilateral upper extremity supported Standing balance-Leahy Scale: Good                               Pertinent Vitals/Pain Pain Assessment Pain Assessment: No/denies pain    Home Living Family/patient expects to be discharged to:: Private residence Living Arrangements: Other relatives;Children Available Help at Discharge: Family;Available PRN/intermittently Type of Home: House Home Access: Stairs to enter Entrance Stairs-Rails: Right;Left Entrance Stairs-Number of Steps: 2   Home Layout: One level Home Equipment:  None      Prior Function Prior Level of Function : Independent/Modified Independent                     Extremity/Trunk Assessment   Upper Extremity Assessment Upper Extremity Assessment: Overall WFL for tasks assessed    Lower Extremity Assessment Lower Extremity  Assessment: Overall WFL for tasks assessed;RLE deficits/detail;LLE deficits/detail    Cervical / Trunk Assessment Cervical / Trunk Assessment: Normal  Communication   Communication Communication: No apparent difficulties  Cognition Arousal: Alert Behavior During Therapy: WFL for tasks assessed/performed Overall Cognitive Status: Within Functional Limits for tasks assessed                                          General Comments      Exercises     Assessment/Plan    PT Assessment Patient needs continued PT services  PT Problem List Decreased strength;Decreased activity tolerance;Decreased balance;Decreased mobility;Decreased knowledge of use of DME;Decreased safety awareness;Decreased knowledge of precautions       PT Treatment Interventions DME instruction;Gait training;Stair training;Functional mobility training;Therapeutic activities;Therapeutic exercise;Balance training;Patient/family education    PT Goals (Current goals can be found in the Care Plan section)  Acute Rehab PT Goals Patient Stated Goal: to return home PT Goal Formulation: With patient Time For Goal Achievement: 12/31/22 Potential to Achieve Goals: Good    Frequency Min 1X/week     Co-evaluation               AM-PAC PT "6 Clicks" Mobility  Outcome Measure Help needed turning from your back to your side while in a flat bed without using bedrails?: A Little Help needed moving from lying on your back to sitting on the side of a flat bed without using bedrails?: A Little Help needed moving to and from a bed to a chair (including a wheelchair)?: A Little Help needed standing up from a chair using your arms (e.g., wheelchair or bedside chair)?: A Little Help needed to walk in hospital room?: A Little Help needed climbing 3-5 steps with a railing? : A Little 6 Click Score: 18    End of Session Equipment Utilized During Treatment: Gait belt Activity Tolerance: Patient tolerated  treatment well Patient left: in chair;with call bell/phone within reach;with chair alarm set Nurse Communication: Mobility status PT Visit Diagnosis: Unsteadiness on feet (R26.81);Other abnormalities of gait and mobility (R26.89);Muscle weakness (generalized) (M62.81);Difficulty in walking, not elsewhere classified (R26.2)    Time: 4403-4742 PT Time Calculation (min) (ACUTE ONLY): 29 min   Charges:   PT Evaluation $PT Eval Low Complexity: 1 Low PT Treatments $Gait Training: 8-22 mins PT General Charges $$ ACUTE PT VISIT: 1 Visit         Wynn Maudlin, DPT Acute Rehabilitation Services Office [phone]  12/17/22 11:57 AM

## 2022-12-17 NOTE — Progress Notes (Signed)
Physical Therapy Treatment Patient Details Name: Jill Jackson MRN: [id number] DOB: 06-06-1940 Today's Date: 12/17/2022   History of Present Illness Patient is 82 y.o. female presented to ED after four days of nausea, poor appetite occasional vomiting with ingestion of food as well as a mild to moderate headache all over her head without any particular aggravating or relieving factor. PMH significant for Afib, HTN, HLD. In ED pt found to be + for COVID-19.    PT Comments  Patient declining follow up and preferring to return home with HEP. Educated on benefits of HH and OPPT and encouraged pt to reach out to PCP if she changes her mind. Provided handout for HEP and reviewed all seated and standing exercises. Pt required cues to count repetitions and fatigued halfway through standing exercises requiring seated rest break. Educated to have chair close by for rest breaks as needed. Will continue to progress pt as able during stay.   If plan is discharge home, recommend the following: A little help with walking and/or transfers;A little help with bathing/dressing/bathroom;Help with stairs or ramp for entrance;Assist for transportation;Assistance with cooking/housework   Can travel by private Scientist, research (medical) walker (2 wheels)    Recommendations for Other Services       Precautions / Restrictions Precautions Precautions: Fall Restrictions Weight Bearing Restrictions: No     Mobility  Bed Mobility Overal bed mobility: Needs Assistance Bed Mobility: Supine to Sit     Supine to sit: Supervision, Used rails     General bed mobility comments: pt OOB    Transfers Overall transfer level: Needs assistance Equipment used: None, Rolling walker (2 wheels) Transfers: Sit to/from Stand Sit to Stand: Contact guard assist           General transfer comment: CGA for safety, pt reliant on UE for power up and controlled lowering     Ambulation/Gait Ambulation/Gait assistance: Contact guard assist Gait Distance (Feet): 6 Feet Assistive device: None Gait Pattern/deviations: Step-through pattern, Decreased stride length, Trunk flexed Gait velocity: decr     General Gait Details: small steps in room for bedside HEP review   Stairs             Wheelchair Mobility     Tilt Bed    Modified Rankin (Stroke Patients Only)       Balance Overall balance assessment: Needs assistance Sitting-balance support: Feet supported Sitting balance-Leahy Scale: Good     Standing balance support: During functional activity, No upper extremity supported, Bilateral upper extremity supported Standing balance-Leahy Scale: Good                              Cognition Arousal: Alert Behavior During Therapy: WFL for tasks assessed/performed Overall Cognitive Status: Within Functional Limits for tasks assessed                                          Exercises      General Comments        Pertinent Vitals/Pain Pain Assessment Pain Assessment: No/denies pain    Home Living Family/patient expects to be discharged to:: Private residence Living Arrangements: Other relatives;Children Available Help at Discharge: Family;Available PRN/intermittently Type of Home: House Home Access: Stairs to enter Entrance Stairs-Rails: Right;Left Entrance Stairs-Number of Steps: 2   Home  Layout: One level Home Equipment: None      Prior Function            PT Goals (current goals can now be found in the care plan section) Acute Rehab PT Goals Patient Stated Goal: to return home PT Goal Formulation: With patient Time For Goal Achievement: 12/31/22 Potential to Achieve Goals: Good    Frequency    Min 1X/week      PT Plan      Co-evaluation              AM-PAC PT "6 Clicks" Mobility   Outcome Measure  Help needed turning from your back to your side while in a flat bed  without using bedrails?: A Little Help needed moving from lying on your back to sitting on the side of a flat bed without using bedrails?: A Little Help needed moving to and from a bed to a chair (including a wheelchair)?: A Little Help needed standing up from a chair using your arms (e.g., wheelchair or bedside chair)?: A Little Help needed to walk in hospital room?: A Little Help needed climbing 3-5 steps with a railing? : A Little 6 Click Score: 18    End of Session Equipment Utilized During Treatment: Gait belt Activity Tolerance: Patient tolerated treatment well Patient left: in chair;with call bell/phone within reach;with chair alarm set Nurse Communication: Mobility status PT Visit Diagnosis: Unsteadiness on feet (R26.81);Other abnormalities of gait and mobility (R26.89);Muscle weakness (generalized) (M62.81);Difficulty in walking, not elsewhere classified (R26.2)     Time: 6962-9528 PT Time Calculation (min) (ACUTE ONLY): 16 min  Charges:    $Therapeutic Exercise: 8-22 mins PT General Charges $$ ACUTE PT VISIT: 1 Visit                     Wynn Maudlin, DPT Acute Rehabilitation Services Office [phone]  12/17/22 3:23 PM

## 2022-12-17 NOTE — Discharge Summary (Signed)
Physician Discharge Summary  Jill Jackson QVZ:[id number] DOB: 06/07/1940 DOA: 12/16/2022  PCP: Kaleen Mask, MD  Admit date: 12/16/2022 Discharge date: 12/17/2022  Admitted From: Home  Discharge disposition: Home   Recommendations for Outpatient Follow-Up:   Follow up with your primary care provider in one week.  Check CBC, BMP, magnesium in the next visit  Discharge Diagnosis:   Principal Problem:   Hyponatremia Active Problems:   Atrial fibrillation (HCC)   Neutropenia (HCC)   COVID-19 virus infection   Discharge Condition: Improved.  Diet recommendation:   Regular.  Wound care: None.  Code status: Full.   History of Present Illness:   Jill Jackson is a 82 y.o. female with past medical history of chronic atrial fibrillation  on anticoagulation,  osteoarthritis  was in her usual state of health till approximately 4 days ago when patient reports simultaneous onset of syndrome of nausea, poor appetite occasional vomiting, ingestion of food  and mild to moderate headache without fever, cough, shortness of breath, diarrhea.Had been drinking clear fluids but had become progressively weak so presented to hospital for further evaluation and treatment.  In the ED, patient was noted to have hyponatremia so was admitted hospital for further evaluation and treatment.Marland Kitchen  Hospital Course:   Following conditions were addressed during hospitalization as listed below,  * Hyponatremia Likely hypovolemic hyponatremia.  Received IV fluid in the ED with improvement of sodium from 122 to 131 today. TSH within normal limits., cortisol within normal range at 20.2.  Urinary sodium pending.    COVID-19 virus infection Unvaccinated.  chest x-ray negative.  Due to history of COPD patient was offered remdesivir which she has declined.   Currently on room air.  Headache.  CT head was unremarkable.  Could be secondary to COVID.  Supportive care.  Improved    Neutropenia, lymphopenia Secondary to COVID illness.  Improved at this time.  Atrial fibrillation (HCC) Continue Eliquis and Cardizem.  Weakness debility.  Has been seen by PT and recommend home PT on discharge.    Disposition.  At this time, patient is stable for disposition home with home health.  Medical Consultants:   None.  Procedures:    None Subjective:   Today, patient was seen and examined at bedside.  Feels better with breathing.  Ambulated with physical therapy.  Energy has improved.  Discharge Exam:   Vitals:   12/17/22 0741 12/17/22 0823  BP: (!) 114/50   Pulse: 76   Resp: 16   Temp: 98.1 F (36.7 C)   SpO2: 95% 99%   Vitals:   12/16/22 2253 12/17/22 0458 12/17/22 0741 12/17/22 0823  BP: 123/70 (!) 138/122 (!) 114/50   Pulse: 89 (!) 107 76   Resp: 17 18 16    Temp: 98 F (36.7 C) (!) 97.4 F (36.3 C) 98.1 F (36.7 C)   TempSrc: Oral Oral Oral   SpO2: 93% 97% 95% 99%  Weight: 58.2 kg     Height: 5\' 5"  (1.651 m)      Body mass index is 21.35 kg/m.   General: Alert awake, not in obvious distress, thinly built HENT: pupils equally reacting to light,  No scleral pallor or icterus noted. Oral mucosa is moist.  Chest:    Diminished breath sounds bilaterally. No crackles or wheezes.  CVS: S1 &S2 heard. No murmur.  Regular rate and rhythm. Abdomen: Soft, nontender, nondistended.  Bowel sounds are heard.   Extremities: No cyanosis, clubbing or edema.  Peripheral pulses are  palpable. Psych: Alert, awake and oriented, normal mood CNS:  No cranial nerve deficits.  Power equal in all extremities.   Skin: Warm and dry.  No rashes noted.  The results of significant diagnostics from this hospitalization (including imaging, microbiology, ancillary and laboratory) are listed below for reference.     Diagnostic Studies:   DG Abd 2 Views  Result Date: 12/16/2022 CLINICAL DATA:  Headache and vomiting.  COVID-19. EXAM: ABDOMEN - 2 VIEW; CHEST - 2 VIEW  COMPARISON:  02/08/2022, 01/11/2022. FINDINGS: The heart is normal in size and the mediastinal contour is within normal limits. There is atherosclerotic calcification. There is hyperinflation of the lungs with flattening of the diaphragms and increased AP diameter of the chest suggesting chronic obstructive pulmonary disease. No consolidation, effusion, or pneumothorax. Degenerative changes are present in the thoracic spine. The bowel gas pattern is normal. There is no evidence of free air. A 7 mm calculus is present over the left kidney. IMPRESSION: 1. No acute cardiopulmonary process. 2. Chronic obstructive pulmonary disease. 3. Nonobstructive bowel-gas pattern. 4. Left renal calculus. Electronically Signed   By: Thornell Sartorius M.D.   On: 12/16/2022 23:24   DG Chest 2 View  Result Date: 12/16/2022 CLINICAL DATA:  Headache and vomiting.  COVID-19. EXAM: ABDOMEN - 2 VIEW; CHEST - 2 VIEW COMPARISON:  02/08/2022, 01/11/2022. FINDINGS: The heart is normal in size and the mediastinal contour is within normal limits. There is atherosclerotic calcification. There is hyperinflation of the lungs with flattening of the diaphragms and increased AP diameter of the chest suggesting chronic obstructive pulmonary disease. No consolidation, effusion, or pneumothorax. Degenerative changes are present in the thoracic spine. The bowel gas pattern is normal. There is no evidence of free air. A 7 mm calculus is present over the left kidney. IMPRESSION: 1. No acute cardiopulmonary process. 2. Chronic obstructive pulmonary disease. 3. Nonobstructive bowel-gas pattern. 4. Left renal calculus. Electronically Signed   By: Thornell Sartorius M.D.   On: 12/16/2022 23:24   CT Head Wo Contrast  Result Date: 12/16/2022 CLINICAL DATA:  Headache, new onset (Age >= 51y); Neck pain, chronic with worsening headache EXAM: CT HEAD WITHOUT CONTRAST CT CERVICAL SPINE WITHOUT CONTRAST TECHNIQUE: Multidetector CT imaging of the head and cervical spine  was performed following the standard protocol without intravenous contrast. Multiplanar CT image reconstructions of the cervical spine were also generated. RADIATION DOSE REDUCTION: This exam was performed according to the departmental dose-optimization program which includes automated exposure control, adjustment of the mA and/or kV according to patient size and/or use of iterative reconstruction technique. COMPARISON:  None Available. FINDINGS: CT HEAD FINDINGS Brain: No hemorrhage. No hydrocephalus. No extra-axial fluid collection. No CT evidence of an acute cortical infarct. Mass effect. No mass lesion. There is sequela of mild chronic microvascular ischemic change. Vascular: No hyperdense vessel or unexpected calcification. Skull: Normal. Negative for fracture or focal lesion. Sinuses/Orbits: Moderate right-sided mastoid effusion and middle ear effusion. No left-sided mastoid or middle ear effusion. Paranasal sinuses are clear. Bilateral lens replacement. Orbits are otherwise unremarkable. Other: None. CT CERVICAL SPINE FINDINGS Alignment: Grade 1 anterolisthesis of C4 on C5. Skull base and vertebrae: No acute fracture. No primary bone lesion or focal pathologic process. Soft tissues and spinal canal: No prevertebral fluid or swelling. No visible canal hematoma. Disc levels:  No CT evidence of high-grade spinal canal stenosis. Upper chest: Negative Other: None IMPRESSION: 1. No acute intracranial abnormality. 2. No acute fracture or traumatic malalignment of the cervical spine. 3. Moderate  right-sided mastoid effusion and middle ear effusion. Correlate with physical exam for signs of otomastoiditis. Electronically Signed   By: Lorenza Cambridge M.D.   On: 12/16/2022 16:48   CT Cervical Spine Wo Contrast  Result Date: 12/16/2022 CLINICAL DATA:  Headache, new onset (Age >= 51y); Neck pain, chronic with worsening headache EXAM: CT HEAD WITHOUT CONTRAST CT CERVICAL SPINE WITHOUT CONTRAST TECHNIQUE: Multidetector CT  imaging of the head and cervical spine was performed following the standard protocol without intravenous contrast. Multiplanar CT image reconstructions of the cervical spine were also generated. RADIATION DOSE REDUCTION: This exam was performed according to the departmental dose-optimization program which includes automated exposure control, adjustment of the mA and/or kV according to patient size and/or use of iterative reconstruction technique. COMPARISON:  None Available. FINDINGS: CT HEAD FINDINGS Brain: No hemorrhage. No hydrocephalus. No extra-axial fluid collection. No CT evidence of an acute cortical infarct. Mass effect. No mass lesion. There is sequela of mild chronic microvascular ischemic change. Vascular: No hyperdense vessel or unexpected calcification. Skull: Normal. Negative for fracture or focal lesion. Sinuses/Orbits: Moderate right-sided mastoid effusion and middle ear effusion. No left-sided mastoid or middle ear effusion. Paranasal sinuses are clear. Bilateral lens replacement. Orbits are otherwise unremarkable. Other: None. CT CERVICAL SPINE FINDINGS Alignment: Grade 1 anterolisthesis of C4 on C5. Skull base and vertebrae: No acute fracture. No primary bone lesion or focal pathologic process. Soft tissues and spinal canal: No prevertebral fluid or swelling. No visible canal hematoma. Disc levels:  No CT evidence of high-grade spinal canal stenosis. Upper chest: Negative Other: None IMPRESSION: 1. No acute intracranial abnormality. 2. No acute fracture or traumatic malalignment of the cervical spine. 3. Moderate right-sided mastoid effusion and middle ear effusion. Correlate with physical exam for signs of otomastoiditis. Electronically Signed   By: Lorenza Cambridge M.D.   On: 12/16/2022 16:48     Labs:   Basic Metabolic Panel: Recent Labs  Lab 12/16/22 1240 12/16/22 1712 12/16/22 1729 12/16/22 2308 12/17/22 0729  NA 122* 133* 128*  --  131*  K 4.6 4.2 4.7  --  4.4  CL 91* 102 97*   --  102  CO2 22  --  20*  --  19*  GLUCOSE 91 78 89  --  83  BUN 17 14 15   --  16  CREATININE 1.01* 0.70 0.97 0.97 0.95  CALCIUM 8.6*  --  8.5*  --  8.3*   GFR Estimated Creatinine Clearance: 40.4 mL/min (by C-G formula based on SCr of 0.95 mg/dL). Liver Function Tests: Recent Labs  Lab 12/17/22 0734  AST 26  ALT 22  ALKPHOS 81  BILITOT 0.7  PROT 6.4*  ALBUMIN 3.4*   No results for input(s): "LIPASE", "AMYLASE" in the last 168 hours. No results for input(s): "AMMONIA" in the last 168 hours. Coagulation profile Recent Labs  Lab 12/17/22 0734  INR 1.2    CBC: Recent Labs  Lab 12/16/22 1240 12/16/22 1712 12/16/22 2308 12/17/22 0729  WBC 2.7*  --  4.3 3.3*  NEUTROABS 1.6*  --   --   --   HGB 13.3 11.6* 14.1 13.2  HCT 36.7 34.0* 39.5 36.8  MCV 93.1  --  92.3 90.4  PLT 222  --  238 241   Cardiac Enzymes: No results for input(s): "CKTOTAL", "CKMB", "CKMBINDEX", "TROPONINI" in the last 168 hours. BNP: Invalid input(s): "POCBNP" CBG: No results for input(s): "GLUCAP" in the last 168 hours. D-Dimer No results for input(s): "DDIMER" in the last  72 hours. Hgb A1c No results for input(s): "HGBA1C" in the last 72 hours. Lipid Profile No results for input(s): "CHOL", "HDL", "LDLCALC", "TRIG", "CHOLHDL", "LDLDIRECT" in the last 72 hours. Thyroid function studies Recent Labs    12/17/22 0727  TSH 3.368   Anemia work up Recent Labs    12/17/22 0734  VITAMINB12 474  FOLATE 24.6   Microbiology Recent Results (from the past 240 hour(s))  Resp panel by RT-PCR (RSV, Flu A&B, Covid) Anterior Nasal Swab     Status: Abnormal   Collection Time: 12/16/22 12:40 PM   Specimen: Anterior Nasal Swab  Result Value Ref Range Status   SARS Coronavirus 2 by RT PCR POSITIVE (A) NEGATIVE Final   Influenza A by PCR NEGATIVE NEGATIVE Final   Influenza B by PCR NEGATIVE NEGATIVE Final    Comment: (NOTE) The Xpert Xpress SARS-CoV-2/FLU/RSV plus assay is intended as an aid in the  diagnosis of influenza from Nasopharyngeal swab specimens and should not be used as a sole basis for treatment. Nasal washings and aspirates are unacceptable for Xpert Xpress SARS-CoV-2/FLU/RSV testing.  Fact Sheet for Patients: BloggerCourse.com  Fact Sheet for Healthcare Providers: SeriousBroker.it  This test is not yet approved or cleared by the [place] FDA and has been authorized for detection and/or diagnosis of SARS-CoV-2 by FDA under an Emergency Use Authorization (EUA). This EUA will remain in effect (meaning this test can be used) for the duration of the COVID-19 declaration under Section 564(b)(1) of the Act, 21 U.S.C. section 360bbb-3(b)(1), unless the authorization is terminated or revoked.     Resp Syncytial Virus by PCR NEGATIVE NEGATIVE Final    Comment: (NOTE) Fact Sheet for Patients: BloggerCourse.com  Fact Sheet for Healthcare Providers: SeriousBroker.it  This test is not yet approved or cleared by the [place] FDA and has been authorized for detection and/or diagnosis of SARS-CoV-2 by FDA under an Emergency Use Authorization (EUA). This EUA will remain in effect (meaning this test can be used) for the duration of the COVID-19 declaration under Section 564(b)(1) of the Act, 21 U.S.C. section 360bbb-3(b)(1), unless the authorization is terminated or revoked.  Performed at [facility], [address], [place], [place] [address]      Discharge Instructions:   Discharge Instructions     Call MD for:  difficulty breathing, headache or visual disturbances   Complete by: As directed    Call MD for:  temperature >100.4   Complete by: As directed    Diet general   Complete by: As directed    Discharge instructions   Complete by: As directed    Follow-up with your primary care provider in 1 week.  Increase fluid intake.  Seek medical  attention for worsening symptoms.   Increase activity slowly   Complete by: As directed       Allergies as of 12/17/2022       Reactions   Tetracyclines & Related Nausea And Vomiting        Medication List     TAKE these medications    acetaminophen 650 MG CR tablet Commonly known as: TYLENOL Take 650 mg by mouth 2 (two) times daily as needed for pain.   apixaban 2.5 MG Tabs tablet Commonly known as: ELIQUIS Take 1 tablet (2.5 mg total) by mouth 2 (two) times daily.   atorvastatin 10 MG tablet Commonly known as: LIPITOR TAKE 1 TABLET(10 MG) BY MOUTH DAILY What changed: See the new instructions.   diltiazem 180 MG 24  hr capsule Commonly known as: TIAZAC Take 1 capsule (180 mg total) by mouth daily at 12 noon.   diltiazem 30 MG tablet Commonly known as: Cardizem Take 1 tablet (30 mg total) by mouth 3 (three) times daily as needed.   HYDROcodone-acetaminophen 5-325 MG tablet Commonly known as: NORCO/VICODIN Take 0.5 tablets by mouth 2 (two) times daily as needed for severe pain.   latanoprost 0.005 % ophthalmic solution Commonly known as: XALATAN Place 1 drop into both eyes at bedtime.   levothyroxine 50 MCG tablet Commonly known as: SYNTHROID Take 50 mcg by mouth daily before breakfast.   nitroGLYCERIN 0.4 MG SL tablet Commonly known as: NITROSTAT Place 1 tablet (0.4 mg total) under the tongue every 5 (five) minutes as needed for up to 25 days for chest pain.   OVER THE COUNTER MEDICATION Take 1 capsule by mouth at bedtime. Relaxium Sleep   pantoprazole 40 MG tablet Commonly known as: PROTONIX Take 40 mg by mouth daily at 12 noon.   psyllium 58.6 % packet Commonly known as: METAMUCIL Take 1 packet by mouth daily as needed (stomach issues).   senna 8.6 MG Tabs tablet Commonly known as: SENOKOT Take 8.6 mg by mouth at bedtime.   spironolactone 25 MG tablet Commonly known as: ALDACTONE Take 1 tablet (25 mg total) by mouth daily. What changed:  when  to take this additional instructions   Tiotropium Bromide-Olodaterol 2.5-2.5 MCG/ACT Aers Inhale 2 puffs into the lungs daily. What changed:  when to take this additional instructions   VITAMIN D-3 PO Take 1 capsule by mouth daily at 12 noon.               Durable Medical Equipment  (From admission, onward)           Start     Ordered   12/17/22 1136  For home use only DME Walker rolling  Once       Question Answer Comment  Walker: With 5 Inch Wheels   Patient needs a walker to treat with the following condition Weakness      12/17/22 1135            Follow-up Information     Kaleen Mask, MD Follow up in 1 week(s).   Specialty: Family Medicine Contact information: [address] [place] [address] [phone]                  Time coordinating discharge: 39 minutes  Signed:  Angelo Caroll  [facility] 12/17/2022, 1:58 PM

## 2022-12-17 NOTE — TOC Transition Note (Addendum)
Transition of Care [facility]) - CM/SW Discharge Note   Patient Details  Name: Jill Jackson MRN: [id number] Date of Birth: June 09, 1940  [facility] of [facility] CM/SW Contact:  Lawerance Sabal, RN Phone Number: 12/17/2022, 11:15 AM   Clinical Narrative:      Patient declined HH services. She has requested the PT leave her with a list of excercises to take home. I have notified the attending, nurse and PT.  [employer] to deliver RW to room prior to DC  No other TOC needs identified at this time   Final next level of care: Home/Self Care Barriers to Discharge: No Barriers Identified   Patient Goals and CMS Choice      Discharge Placement                         Discharge Plan and Services Additional resources added to the After Visit Summary for                                       Social Determinants of Health (SDOH) Interventions SDOH Screenings   Food Insecurity: No Food Insecurity (12/16/2022)  Housing: Low Risk  (12/16/2022)  Transportation Needs: No Transportation Needs (12/16/2022)  Utilities: Not At Risk (12/16/2022)  Social Connections: Unknown (08/17/2021)   Received from [facility]  Tobacco Use: High Risk (12/16/2022)     Readmission Risk Interventions     No data to display

## 2023-02-15 ENCOUNTER — Other Ambulatory Visit (HOSPITAL_BASED_OUTPATIENT_CLINIC_OR_DEPARTMENT_OTHER): Payer: Self-pay

## 2023-02-15 MED ORDER — TIOTROPIUM BROMIDE-OLODATEROL 2.5-2.5 MCG/ACT IN AERS: 2.0000 | INHALATION_SPRAY | Freq: Every day | RESPIRATORY_TRACT | 3 refills | Status: DC

## 2023-02-16 ENCOUNTER — Ambulatory Visit: Payer: Medicare PPO | Admitting: Internal Medicine

## 2023-04-26 ENCOUNTER — Ambulatory Visit: Payer: Medicare PPO | Admitting: Internal Medicine

## 2023-06-07 ENCOUNTER — Ambulatory Visit: Payer: Medicare PPO | Admitting: Cardiology

## 2023-07-04 ENCOUNTER — Encounter: Payer: Self-pay | Admitting: Internal Medicine

## 2023-07-04 ENCOUNTER — Ambulatory Visit: Payer: Medicare PPO | Admitting: Internal Medicine

## 2023-07-04 VITALS — BP 132/84 | HR 64 | Ht 63.0 in | Wt 125.6 lb

## 2023-07-04 DIAGNOSIS — I480 Paroxysmal atrial fibrillation: Secondary | ICD-10-CM | POA: Diagnosis not present

## 2023-07-04 DIAGNOSIS — J4489 Other specified chronic obstructive pulmonary disease: Secondary | ICD-10-CM

## 2023-07-04 DIAGNOSIS — Z720 Tobacco use: Secondary | ICD-10-CM

## 2023-07-04 DIAGNOSIS — J439 Emphysema, unspecified: Secondary | ICD-10-CM | POA: Diagnosis not present

## 2023-07-04 MED ORDER — TIOTROPIUM BROMIDE-OLODATEROL 2.5-2.5 MCG/ACT IN AERS: 2.0000 | INHALATION_SPRAY | Freq: Every day | RESPIRATORY_TRACT | 3 refills | Status: DC

## 2023-07-04 NOTE — Progress Notes (Signed)
 Jill Jackson    [id number]    14-Jun-1940  Primary Care Physician:Elkins, Curly Rim, MD Date of Appointment: 07/04/2023 Established Patient Visit  Chief complaint:   Chief Complaint  Patient presents with   Follow-up    She states she is having weak spells, causing shallow breathing. Off and on for a couple month.     HPI: Jill Jackson is a 83 y.o. woman with history of COPD FEV1 60% of predicted.  Interval Updates: Here for follow up. Was in the hospital in September 2024 for hyponatremia related to covid use and decreased oral intake.   Still on stiolto 2 puffs once daily, with minimal prn xopanex use.  Feels breathing is doing well. No adverse effects.   Appetite has been ok.  Minimal cigarette use less than one cigarette/week.   Gerd controlled on pantoprazole.  Lives at home with daughter and granddaughter.   I have reviewed the patient's family social and past medical history and updated as appropriate.   Past Medical History:  Diagnosis Date   Atrial fibrillation (HCC) 11/01/2018   HLD (hyperlipidemia) 11/01/2018   Hypertension     Past Surgical History:  Procedure Laterality Date   TUBAL LIGATION  1977    Family History  Problem Relation Age of Onset   Stroke Mother    Kidney failure Sister    Hypertension Sister    Thyroid disease Sister    Stroke Brother    Hypertension Brother     Social History   Occupational History   Not on file  Tobacco Use   Smoking status: Some Days    Current packs/day: 0.25    Average packs/day: 0.3 packs/day for 30.0 years (7.5 ttl pk-yrs)    Types: Cigarettes    Passive exposure: Current   Smokeless tobacco: Never   Tobacco comments:    About 1 cigarette per week PAP 07/04/2023  Vaping Use   Vaping status: Never Used  Substance and Sexual Activity   Alcohol use: Not Currently   Drug use: Not Currently   Sexual activity: Not Currently     Physical Exam: Blood pressure  132/84, pulse 64, height 5\' 3"  (1.6 m), weight 125 lb 9.6 oz (57 kg), SpO2 94%.  Gen:      No acute distress, kyphosis Lungs:    ctab no wheeze CV:        RRR no murmurs  Data Reviewed: Imaging: I have personally reviewed the chest xray Nov 2023 - no acute pulmonary process  PFTs:     Latest Ref Rng & Units 08/21/2020   10:04 AM  PFT Results  FVC-Pre L 2.11   FVC-Predicted Pre % 85   FVC-Post L 2.26   FVC-Predicted Post % 92   Pre FEV1/FVC % % 48   Post FEV1/FCV % % 50   FEV1-Pre L 1.01   FEV1-Predicted Pre % 53   FEV1-Post L 1.14   DLCO uncorrected ml/min/mmHg 12.57   DLCO UNC% % 54   DLCO corrected ml/min/mmHg 12.57   DLCO COR %Predicted % 54   DLVA Predicted % 63   TLC L 6.32   TLC % Predicted % 128   RV % Predicted % 189    I have personally reviewed the patient's PFTs and moderate airflow limitation with air trapping and reduced diffusion capacity.   Labs: Lab Results  Component Value Date   NA 131 (L) 12/17/2022   K 4.4 12/17/2022  CO2 19 (L) 12/17/2022   GLUCOSE 83 12/17/2022   BUN 16 12/17/2022   CREATININE 0.95 12/17/2022   CALCIUM 8.3 (L) 12/17/2022   GFRNONAA 59 (L) 12/17/2022   Lab Results  Component Value Date   WBC 3.3 (L) 12/17/2022   HGB 13.2 12/17/2022   HCT 36.8 12/17/2022   MCV 90.4 12/17/2022   PLT 241 12/17/2022    Immunization status: Immunization History  Administered Date(s) Administered   Influenza, High Dose Seasonal PF 01/24/2018   PFIZER(Purple Top)SARS-COV-2 Vaccination 06/22/2019    External Records Personally Reviewed: hospital stay.   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   Continue xopanex inhaler as needed.   Continue the acid reflux medication.  Continue the eliquis.   Follow up with your primary care doctor about  the chronic back pain - physical therapy may be helpful and they can come to your home.   Return to Care: Return in about 6 months (around 01/03/2024).   Durel Salts, MD Pulmonary and Critical Care Medicine [facility] Office:[phone]

## 2023-07-04 NOTE — Patient Instructions (Addendum)
 It was a pleasure to see you today!  Please schedule follow up scheduled with myself in 6 months.  If my schedule is not open yet, we will contact you with a reminder closer to that time. Please call [phone] if you haven't heard from [place] a month before, and always call us sooner if issues or concerns arise. You can also send [place] a message through MyChart, but but aware that this is not to be used for urgent issues and it may take up to 5-7 days to receive a reply. Please be aware that you will likely be able to view your results before I have a chance to respond to them. Please give [place] 5 business days to respond to any non-urgent results.   Continue the stiolto inhaler 2 puffs once daily.   Continue xopanex inhaler as needed.   Continue the acid reflux medication.  Continue the eliquis.   Follow up with your primary care doctor about the chronic back pain - physical therapy may be helpful and they can come to your home.

## 2023-07-06 ENCOUNTER — Encounter: Payer: Self-pay | Admitting: Cardiology

## 2023-07-06 ENCOUNTER — Ambulatory Visit: Attending: Cardiology | Admitting: Cardiology

## 2023-07-06 VITALS — BP 118/70 | HR 68 | Resp 16 | Ht 63.0 in | Wt 125.0 lb

## 2023-07-06 DIAGNOSIS — I480 Paroxysmal atrial fibrillation: Secondary | ICD-10-CM

## 2023-07-06 DIAGNOSIS — I25118 Atherosclerotic heart disease of native coronary artery with other forms of angina pectoris: Secondary | ICD-10-CM | POA: Diagnosis not present

## 2023-07-06 DIAGNOSIS — I10 Essential (primary) hypertension: Secondary | ICD-10-CM | POA: Diagnosis not present

## 2023-07-06 NOTE — Patient Instructions (Signed)
  Follow-Up: At [facility], you and your health needs are our priority.  As part of our continuing mission to provide you with exceptional heart care, our providers are all part of one team.  This team includes your primary Cardiologist (physician) and Advanced Practice Providers or APPs (Physician Assistants and Nurse Practitioners) who all work together to provide you with the care you need, when you need it.  Your next appointment:   6 month(s)  Provider:   Elder Negus, MD     We recommend signing up for the patient portal called "MyChart".  Sign up information is provided on this After Visit Summary.  MyChart is used to connect with patients for Virtual Visits (Telemedicine).  Patients are able to view lab/test results, encounter notes, upcoming appointments, etc.  Non-urgent messages can be sent to your provider as well.   To learn more about what you can do with MyChart, go to [email or web address].   Other Instructions       1st Floor: - Lobby - Registration  - Pharmacy  - Lab - Cafe  2nd Floor: - PV Lab - Diagnostic Testing (echo, CT, nuclear med)  3rd Floor: - Vacant  4th Floor: - TCTS (cardiothoracic surgery) - AFib Clinic - Structural Heart Clinic - Vascular Surgery  - Vascular Ultrasound  5th Floor: - HeartCare Cardiology (general and EP) - Clinical Pharmacy for coumadin, hypertension, lipid, weight-loss medications, and med management appointments    Valet parking services will be available as well.

## 2023-07-06 NOTE — Progress Notes (Signed)
  Cardiology Office Note:  .   Date:  07/06/2023  ID:  Jill Jackson, DOB 1941/01/29, MRN [id number] PCP: Kaleen Mask, MD   [facility] Providers Cardiologist:  Truett Mainland, MD PCP: Kaleen Mask, MD  Chief Complaint  Patient presents with   Atrial Fibrillation   Follow-up     Jill Jackson is a 83 y.o. female with hypertension, paroxysmal atrial fibrillation, nonobstructive CAD, COPD   Patient is doing fairly well.  She had 1 episode of rapid heartbeat 206 bpm, that came down with diltiazem 30 mg once.  This was back in February 2025.  She has mild unchanged exertional dyspnea.  No new symptoms noted.    Vitals:   07/06/23 1115  BP: 118/70  Pulse: 68  Resp: 16  SpO2: 94%      Review of Systems  Cardiovascular:  Positive for dyspnea on exertion (Mild, stable) and palpitations (Occasional). Negative for chest pain, leg swelling and syncope.        Studies Reviewed: Marland Kitchen        EKG 12/16/2022: Sinus rhythm 64 bpm Nonspecific ST abnormality  Independently interpreted 12/2022: Hb 13.2 Cr 0.9 TSH 3.3  Risk Assessment/Calculations:    CHA2DS2-VASc Score = 4  This indicates a 4.8% annual risk of stroke. The patient's score is based upon: CHF History: 0 HTN History: 1 Diabetes History: 0 Stroke History: 0 Vascular Disease History: 0 Age Score: 2 Gender Score: 1       Physical Exam Vitals and nursing note reviewed.  Constitutional:      General: She is not in acute distress. Neck:     Vascular: No JVD.  Cardiovascular:     Rate and Rhythm: Normal rate and regular rhythm.     Heart sounds: Normal heart sounds. No murmur heard. Pulmonary:     Effort: Pulmonary effort is normal.     Breath sounds: Normal breath sounds. No wheezing or rales.  Musculoskeletal:     Right lower leg: No edema.     Left lower leg: No edema.      VISIT DIAGNOSES:   ICD-10-CM   1. Paroxysmal atrial fibrillation (HCC)   I48.0     2. Essential hypertension  I10     3. Coronary artery disease of native artery of native heart with stable angina pectoris [facility])  I25.118        Jill Jackson is a 83 y.o. female with hypertension, paroxysmal atrial fibrillation, nonobstructive CAD, COPD   Assessment & Plan  PAF: Maintaining sinus rhythm.  H/o Amiodarone induced hypothyroidism.   Tolerating diltiazem 180 mg daily along with as needed diltiazem 30 mg (2 months in 05/2023). CHA2DS2VAsc score 4, annual stroke risk 5%. With age >80-year, and weight <60 kg, recommend Eliquis 2.5 mg twice daily.      Mitral regurgitation: Mild to moderate, clinically stable.   CAD without angina: While she does have LAD and RCA calcification and very likely has multivessel coronary artery disease, her stress test does not show any ischemia (08/2019)  Repeat lipid panel results from PCP office.  Hypertension: Well controlled.      F/u in 6 months  Signed, Elder Negus, MD

## 2023-08-07 ENCOUNTER — Telehealth: Payer: Self-pay

## 2023-08-07 MED ORDER — TIOTROPIUM BROMIDE-OLODATEROL 2.5-2.5 MCG/ACT IN AERS: 2.0000 | INHALATION_SPRAY | Freq: Every day | RESPIRATORY_TRACT | 6 refills | Status: DC

## 2023-08-07 NOTE — Telephone Encounter (Signed)
 Copied from CRM [id number]. Topic: Clinical - Prescription Issue >> Aug 07, 2023  2:58 PM Margarette Shawl wrote: Reason for CRM:   Pt is requesting if her Tiotropium Bromide -Olodaterol 2.5-2.5 MCG/ACT AERS could be called in as a 90-day supply. With a 90-day supply she will save more money with copays and would be less trips to the pharmacy. She is due for a refill of the medication and would like it sent to the [facility] on [facility].    CB#[phone]  Refill sent to pharmacy. Nothing further needed

## 2023-09-10 ENCOUNTER — Other Ambulatory Visit: Payer: Self-pay | Admitting: Cardiology

## 2023-09-10 DIAGNOSIS — J449 Chronic obstructive pulmonary disease, unspecified: Secondary | ICD-10-CM

## 2023-09-10 DIAGNOSIS — E782 Mixed hyperlipidemia: Secondary | ICD-10-CM

## 2023-09-18 MED ORDER — TIOTROPIUM BROMIDE-OLODATEROL 2.5-2.5 MCG/ACT IN AERS: 2.0000 | INHALATION_SPRAY | Freq: Every day | RESPIRATORY_TRACT | 3 refills | Status: AC

## 2023-09-18 NOTE — Telephone Encounter (Unsigned)
 Copied from CRM [id number]. Topic: Clinical - Prescription Issue >> Sep 15, 2023  4:42 PM Tyronne Galloway wrote: Reason for CRM: Pt is taking Tiotropium Bromide -Olodaterol 2.5-2.5 MCG/ACT AERS and is requesting when she gets a refill of this medication to send it in as a 90 day supply due to being able to save more money with copays, and would be less trips to the pharmacy. When she is due for a refill of the medication she would like it sent to the [facility] on [facility]   [id number] Jonette Nestle, [place] - [phone] AT [facility] OF [facility] [address] [place] [address] Phone: [phone] Fax: [phone] Hours: Not open 24 hours  Pt's best phone number is [phone] please call and confirm the prescription change is valid.

## 2023-11-27 ENCOUNTER — Other Ambulatory Visit: Payer: Self-pay | Admitting: Cardiology

## 2023-11-27 DIAGNOSIS — I480 Paroxysmal atrial fibrillation: Secondary | ICD-10-CM

## 2023-11-27 NOTE — Telephone Encounter (Signed)
 Prescription refill request for Eliquis  received. Indication:afib Last office visit:4/25 Scr:0.95  9/24 Age: 83 Weight:56.7  kg  Prescription refilled

## 2023-12-08 ENCOUNTER — Telehealth: Payer: Self-pay | Admitting: Cardiology

## 2023-12-08 DIAGNOSIS — Z79899 Other long term (current) drug therapy: Secondary | ICD-10-CM

## 2023-12-08 DIAGNOSIS — E782 Mixed hyperlipidemia: Secondary | ICD-10-CM

## 2023-12-08 DIAGNOSIS — I2510 Atherosclerotic heart disease of native coronary artery without angina pectoris: Secondary | ICD-10-CM

## 2023-12-08 MED ORDER — ATORVASTATIN CALCIUM 10 MG PO TABS
10.0000 mg | ORAL_TABLET | Freq: Every day | ORAL | 3 refills | Status: AC
Start: 2023-12-08 — End: 2024-03-07

## 2023-12-08 NOTE — Telephone Encounter (Signed)
 Pt c/o medication issue:  1. Name of Medication:    2. How are you currently taking this medication (dosage and times per day)?    3. Are you having a reaction (difficulty breathing--STAT)? no  4. What is your medication issue? Patient was calling in for refill but its not listed under her current medication. Verify if patient should be taking medication or not.

## 2023-12-08 NOTE — Telephone Encounter (Signed)
 Reviewed chart. Patient appears to have been on lipitor back in April 2025 but it was discontinued 09/12/23 by refill pool. No recent labs on file. Spoke with Dr. Elmira who advises to refill lipitor and get FLP/ALT. Patient is due for f/u this month, called to discuss. No answer, left detailed message per DPR. Advised of refill, labs ordered and f/u appt scheduled for 01/09/24.

## 2023-12-20 ENCOUNTER — Telehealth: Payer: Self-pay | Admitting: Cardiology

## 2023-12-20 NOTE — Telephone Encounter (Signed)
Yes  Thanks MJP

## 2023-12-20 NOTE — Telephone Encounter (Signed)
 Patient says she is unable to fast to have lab work. She says as soon as she wakes up she has to eat or she doesn't feel well at all. Would like a call back to discuss next steps.

## 2023-12-20 NOTE — Telephone Encounter (Signed)
 FYI, are you okay with this?

## 2023-12-20 NOTE — Telephone Encounter (Signed)
 Left message to call back.

## 2023-12-21 NOTE — Telephone Encounter (Signed)
 Spoke with pt regarding her labs. Pt stated she will not be able to fast for labs as she gets too weak without eating. Pt stated that if it is truly necessary to be fasting then she could have the labs drawn at her PCP's office which is closer to her. Pt was told her question would be forwarded to Dr. Elmira and his nurse. Pt verbalized understanding. All questions if any were answered.

## 2023-12-21 NOTE — Telephone Encounter (Signed)
 Spoke with the patient and advised that she does not need to fast for labs. Patient verbalized understanding.

## 2023-12-21 NOTE — Telephone Encounter (Signed)
 Pt calling back for labs, is she able to continue to eat even though lab is fasting?  She asked if someone leave VM if its okay or not

## 2023-12-21 NOTE — Telephone Encounter (Signed)
 Fasting not necessarily for labs.  Thanks MJP

## 2023-12-21 NOTE — Telephone Encounter (Signed)
 Left message to call back.

## 2023-12-28 ENCOUNTER — Ambulatory Visit: Payer: Self-pay

## 2023-12-28 LAB — ALT: ALT: 17 IU/L (ref 0–32)

## 2023-12-28 LAB — LIPID PANEL
Chol/HDL Ratio: 1.7 ratio (ref 0.0–4.4)
Cholesterol, Total: 162 mg/dL (ref 100–199)
HDL: 93 mg/dL (ref >39)
LDL Chol Calc (NIH): 56 mg/dL (ref 0–99)
Triglycerides: 66 mg/dL (ref 0–149)
VLDL Cholesterol Cal: 13 mg/dL (ref 5–40)

## 2024-01-03 ENCOUNTER — Other Ambulatory Visit: Payer: Self-pay | Admitting: Cardiology

## 2024-01-03 DIAGNOSIS — I480 Paroxysmal atrial fibrillation: Secondary | ICD-10-CM

## 2024-01-04 MED ORDER — DILTIAZEM HCL ER BEADS 180 MG PO CP24: 180.0000 mg | ORAL_CAPSULE | Freq: Every day | ORAL | 2 refills | Status: AC

## 2024-01-10 ENCOUNTER — Ambulatory Visit: Admitting: Internal Medicine

## 2024-01-15 ENCOUNTER — Ambulatory Visit: Admitting: Internal Medicine

## 2024-01-15 ENCOUNTER — Encounter: Payer: Self-pay | Admitting: Internal Medicine

## 2024-01-15 VITALS — BP 130/88 | HR 62 | Temp 98.0°F | Ht 63.0 in | Wt 126.0 lb

## 2024-01-15 DIAGNOSIS — I480 Paroxysmal atrial fibrillation: Secondary | ICD-10-CM | POA: Diagnosis not present

## 2024-01-15 DIAGNOSIS — Z7901 Long term (current) use of anticoagulants: Secondary | ICD-10-CM

## 2024-01-15 DIAGNOSIS — K219 Gastro-esophageal reflux disease without esophagitis: Secondary | ICD-10-CM | POA: Diagnosis not present

## 2024-01-15 DIAGNOSIS — Z720 Tobacco use: Secondary | ICD-10-CM

## 2024-01-15 DIAGNOSIS — M5459 Other low back pain: Secondary | ICD-10-CM

## 2024-01-15 DIAGNOSIS — J449 Chronic obstructive pulmonary disease, unspecified: Secondary | ICD-10-CM | POA: Diagnosis not present

## 2024-01-15 NOTE — Patient Instructions (Addendum)
 It was a pleasure to see you today!  Please schedule follow up us  in 6 months. If my schedule is not open yet, we will contact you with a reminder closer to that time. Please call [phone] if you haven't heard from us  a month before, and always call us  sooner if issues or concerns arise. You can also send us  a message through MyChart, but but aware that this is not to be used for urgent issues and it may take up to 5-7 days to receive a reply. Please be aware that you will likely be able to view your results before I have a chance to respond to them. Please give us  5 business days to respond to any non-urgent results.   Continue the stiolto inhaler 2 puffs once daily. Let us  know if the dry mouth is worsening and we can try a different inhaler.   Continue xopanex inhaler as needed.   Continue the acid reflux medication.  Continue the eliquis .

## 2024-01-15 NOTE — Progress Notes (Signed)
 Jill Jackson    [id number]    1940/07/03  Primary Care Physician:Elkins, Tanda Mae, MD Date of Appointment: 01/15/2024 Established Patient Visit  Chief complaint:   Chief Complaint  Patient presents with   COPD    Patient states her breathing is okay.     HPI: Jill Jackson is a 83 y.o. woman with history of COPD FEV1 60% of predicted.  Interval Updates: Here for copd follow up.  No interval exacerbations or ED visits.  She did call EMS for some breathing issues in August but they did an EKG and she felt better. Was related to palpitations not copd she feels.   still on stiolto 2 puffs once daily, with minimal prn xopanex use.  Having some dry mouth from the stiolto. Feels this is bearable.   Rare cigarette use 1-2 times/week.   Reflux is ok on PPI.   Lives at home with daughter and granddaughter.   I have reviewed the patient's family social and past medical history and updated as appropriate.   Past Medical History:  Diagnosis Date   Atrial fibrillation (HCC) 11/01/2018   HLD (hyperlipidemia) 11/01/2018   Hypertension     Past Surgical History:  Procedure Laterality Date   TUBAL LIGATION  1977    Family History  Problem Relation Age of Onset   Stroke Mother    Kidney failure Sister    Hypertension Sister    Thyroid  disease Sister    Stroke Brother    Hypertension Brother     Social History   Occupational History   Not on file  Tobacco Use   Smoking status: Some Days    Current packs/day: 0.25    Average packs/day: 0.3 packs/day for 30.0 years (7.5 ttl pk-yrs)    Types: Cigarettes    Passive exposure: Current   Smokeless tobacco: Never   Tobacco comments:    About 2 cigarette per week PAP 01/15/2024   Vaping Use   Vaping status: Never Used  Substance and Sexual Activity   Alcohol use: Not Currently   Drug use: Not Currently   Sexual activity: Not Currently     Physical Exam: Blood pressure 130/88,  pulse 62, temperature 98 F (36.7 C), temperature source Oral, height 5' 3 (1.6 m), weight 126 lb (57.2 kg), SpO2 96%.  Gen:    No distress Lungs: ctab no wheeze CV:        RRR no mrg  Data Reviewed: Imaging: I have personally reviewed the chest xray Nov 2023 - no acute pulmonary process  PFTs:     Latest Ref Rng & Units 08/21/2020   10:04 AM  PFT Results  FVC-Pre L 2.11   FVC-Predicted Pre % 85   FVC-Post L 2.26   FVC-Predicted Post % 92   Pre FEV1/FVC % % 48   Post FEV1/FCV % % 50   FEV1-Pre L 1.01   FEV1-Predicted Pre % 53   FEV1-Post L 1.14   DLCO uncorrected ml/min/mmHg 12.57   DLCO UNC% % 54   DLCO corrected ml/min/mmHg 12.57   DLCO COR %Predicted % 54   DLVA Predicted % 63   TLC L 6.32   TLC % Predicted % 128   RV % Predicted % 189    I have personally reviewed the patient's PFTs and moderate airflow limitation with air trapping and reduced diffusion capacity.   Labs: Lab Results  Component Value Date   NA 131 (L) 12/17/2022  K 4.4 12/17/2022   CO2 19 (L) 12/17/2022   GLUCOSE 83 12/17/2022   BUN 16 12/17/2022   CREATININE 0.95 12/17/2022   CALCIUM  8.3 (L) 12/17/2022   GFRNONAA 59 (L) 12/17/2022   Lab Results  Component Value Date   WBC 3.3 (L) 12/17/2022   HGB 13.2 12/17/2022   HCT 36.8 12/17/2022   MCV 90.4 12/17/2022   PLT 241 12/17/2022    Immunization status: Immunization History  Administered Date(s) Administered   INFLUENZA, HIGH DOSE SEASONAL PF 01/24/2018   PFIZER(Purple Top)SARS-COV-2 Vaccination 06/22/2019    External Records Personally Reviewed: hospital stay.   Assessment:  COPD, moderate with FEV1 60% of predicted, well controlled GERD on PPI Tobacco use disorder Atrial fibrillation on eliquis  Chronic Back Pain  Plan/Recommendations:  Continue the stiolto inhaler 2 puffs once daily. Let us  know if the dry mouth is worsening and we can try a different inhaler.   Continue xopanex inhaler as needed.   Continue the acid  reflux medication.  continue the eliquis .   Return to Care: Return in about 6 months (around 07/15/2024) for MD visit. Dr Kassie, Adrien Carle Verdon Meade, MD Pulmonary and Critical Care Medicine [facility] [phone]

## 2024-01-19 ENCOUNTER — Ambulatory Visit: Admitting: Cardiology

## 2024-01-19 ENCOUNTER — Ambulatory Visit: Admitting: Physician Assistant

## 2024-01-30 NOTE — Progress Notes (Deleted)
  Cardiology Office Note:  .   Date:  01/30/2024  ID:  Jill Jackson, DOB [birth year], MRN [id number] PCP: Loring Tanda Mae, MD  [facility] Providers Cardiologist:  Newman JINNY Lawrence, MD { Click to update primary MD,subspecialty MD or APP then REFRESH:1}   History of Present Illness: .   Jill Jackson is a 83 y.o. female  with hypertension, paroxysmal atrial fibrillation, nonobstructive CAD, COPD     ROS: ***  Studies Reviewed: SABRA         Prior CV Studies: {Select studies to display:26339}   Echocardiogram 03/16/2022:   Normal LV systolic function with EF 64%. Left ventricle cavity is normal  in size. Normal left ventricular wall thickness. Normal global wall  motion. Doppler evidence of grade I (impaired) diastolic dysfunction,  normal LAP. Calculated EF 64%. Frequent PACs.  Left atrial cavity is mildly dilated.  Trileaflet aortic valve.  Mild (Grade I) aortic regurgitation. Mild aortic  valve leaflet thickening.  Mild posterior mitral valve leaflet calcification. Normal mitral valve  leaflet mobility without restriction. Borderline MV prolapse.  Mild to at  most moderate mitral regurgitation.  Structurally normal tricuspid valve.  Mild tricuspid regurgitation. No  evidence of pulmonary hypertension.  Structurally normal pulmonic valve.  Mild pulmonic regurgitation.  Risk Assessment/Calculations:   {Does this patient have ATRIAL FIBRILLATION?:[phone]} No BP recorded.  {Refresh Note OR Click here to enter BP  :1}***       Physical Exam:   VS:  There were no vitals taken for this visit.   Orhtostatics: No data found. Wt Readings from Last 3 Encounters:  01/15/24 126 lb (57.2 kg)  07/06/23 125 lb (56.7 kg)  07/04/23 125 lb 9.6 oz (57 kg)    GEN: Well nourished, well developed in no acute distress NECK: No JVD; No carotid bruits CARDIAC: ***RRR, no murmurs, rubs, gallops RESPIRATORY:  Clear to auscultation without rales, wheezing or  rhonchi  ABDOMEN: Soft, non-tender, non-distended EXTREMITIES:  No edema; No deformity   ASSESSMENT AND PLAN: .   PAF: Maintaining sinus rhythm.  H/o Amiodarone  induced hypothyroidism.   Tolerating diltiazem  180 mg daily along with as needed diltiazem  30 mg (2 months in 05/2023). CHA2DS2VAsc score 4, annual stroke risk 5%. With age >80-year, and weight <60 kg, recommend Eliquis  2.5 mg twice daily.       Mitral regurgitation: Mild to moderate, clinically stable.   CAD without angina: While she does have LAD and RCA calcification and very likely has multivessel coronary artery disease, her stress test does not show any ischemia (08/2019)  Repeat lipid panel results from PCP office.   Hypertension: Well controlled.         {Are you ordering a CV Procedure (e.g. stress test, cath, DCCV, TEE, etc)?   Press F2        :[id number]}  Dispo: ***  Signed, Olivia Pavy, PA-C

## 2024-02-13 ENCOUNTER — Ambulatory Visit: Admitting: Physician Assistant

## 2024-03-12 NOTE — Progress Notes (Deleted)
  Cardiology Office Note:  .   Date:  03/12/2024  ID:  Marcine Gadway, DOB 12/02/1940, MRN [id number] PCP: Loring Tanda Mae, MD  [facility] Providers Cardiologist:  Newman JINNY Lawrence, MD { Click to update primary MD,subspecialty MD or APP then REFRESH:1}   History of Present Illness: .   Jill Jackson is a 83 y.o. female  with hypertension, paroxysmal atrial fibrillation, nonobstructive CAD, COPD     ROS: ***  Studies Reviewed: SABRA         Prior CV Studies: {Select studies to display:26339}   Echocardiogram 03/16/2022:   Normal LV systolic function with EF 64%. Left ventricle cavity is normal  in size. Normal left ventricular wall thickness. Normal global wall  motion. Doppler evidence of grade I (impaired) diastolic dysfunction,  normal LAP. Calculated EF 64%. Frequent PACs.  Left atrial cavity is mildly dilated.  Trileaflet aortic valve.  Mild (Grade I) aortic regurgitation. Mild aortic  valve leaflet thickening.  Mild posterior mitral valve leaflet calcification. Normal mitral valve  leaflet mobility without restriction. Borderline MV prolapse.  Mild to at  most moderate mitral regurgitation.  Structurally normal tricuspid valve.  Mild tricuspid regurgitation. No  evidence of pulmonary hypertension.  Structurally normal pulmonic valve.  Mild pulmonic regurgitation.  Risk Assessment/Calculations:   {Does this patient have ATRIAL FIBRILLATION?:[id number]} No BP recorded.  {Refresh Note OR Click here to enter BP  :1}***       Physical Exam:   VS:  There were no vitals taken for this visit.   Orhtostatics: No data found. Wt Readings from Last 3 Encounters:  01/15/24 126 lb (57.2 kg)  07/06/23 125 lb (56.7 kg)  07/04/23 125 lb 9.6 oz (57 kg)    GEN: Well nourished, well developed in no acute distress NECK: No JVD; No carotid bruits CARDIAC: ***RRR, no murmurs, rubs, gallops RESPIRATORY:  Clear to auscultation without rales, wheezing or  rhonchi  ABDOMEN: Soft, non-tender, non-distended EXTREMITIES:  No edema; No deformity   ASSESSMENT AND PLAN: .   PAF: Maintaining sinus rhythm.  H/o Amiodarone  induced hypothyroidism.   Tolerating diltiazem  180 mg daily along with as needed diltiazem  30 mg (2 months in 05/2023). CHA2DS2VAsc score 4, annual stroke risk 5%. With age >80-year, and weight <60 kg, recommend Eliquis  2.5 mg twice daily.       Mitral regurgitation: Mild to moderate, clinically stable.   CAD without angina: While she does have LAD and RCA calcification and very likely has multivessel coronary artery disease, her stress test does not show any ischemia (08/2019)  Repeat lipid panel results from PCP office.   Hypertension: Well controlled.         {Are you ordering a CV Procedure (e.g. stress test, cath, DCCV, TEE, etc)?   Press F2        :[id number]}  Dispo: ***  Signed, Olivia Pavy, PA-C

## 2024-03-19 ENCOUNTER — Ambulatory Visit: Admitting: Physician Assistant

## 2024-04-24 ENCOUNTER — Ambulatory Visit: Admitting: General Practice

## 2024-07-02 ENCOUNTER — Ambulatory Visit: Admitting: Cardiology

## 2024-07-22 ENCOUNTER — Encounter
